# Patient Record
Sex: Male | Born: 1960
Health system: Southern US, Community
[De-identification: ages and names within clinical notes are randomized; demographics above are authoritative.]

## PROBLEM LIST (undated history)

## (undated) ENCOUNTER — Emergency Department (HOSPITAL_COMMUNITY): Admission: EM | Payer: 59 | Source: Home / Self Care

## (undated) DIAGNOSIS — I739 Peripheral vascular disease, unspecified: Secondary | ICD-10-CM

## (undated) HISTORY — DX: Peripheral vascular disease, unspecified: I73.9

---

## 1999-06-06 ENCOUNTER — Emergency Department (HOSPITAL_COMMUNITY): Admission: EM | Admit: 1999-06-06 | Discharge: 1999-06-06 | Payer: Self-pay | Admitting: Emergency Medicine

## 2006-02-06 ENCOUNTER — Emergency Department (HOSPITAL_COMMUNITY): Admission: EM | Admit: 2006-02-06 | Discharge: 2006-02-06 | Payer: Self-pay | Admitting: Emergency Medicine

## 2010-01-01 ENCOUNTER — Inpatient Hospital Stay (HOSPITAL_COMMUNITY): Admission: EM | Admit: 2010-01-01 | Discharge: 2010-01-07 | Payer: Self-pay | Admitting: Emergency Medicine

## 2010-01-01 ENCOUNTER — Emergency Department (HOSPITAL_COMMUNITY): Admission: EM | Admit: 2010-01-01 | Discharge: 2010-01-01 | Payer: Self-pay | Admitting: Family Medicine

## 2010-01-02 ENCOUNTER — Ambulatory Visit: Payer: Self-pay | Admitting: Vascular Surgery

## 2010-01-02 ENCOUNTER — Encounter (INDEPENDENT_AMBULATORY_CARE_PROVIDER_SITE_OTHER): Payer: Self-pay | Admitting: Internal Medicine

## 2010-01-06 ENCOUNTER — Encounter (INDEPENDENT_AMBULATORY_CARE_PROVIDER_SITE_OTHER): Payer: Self-pay | Admitting: Cardiology

## 2010-01-10 ENCOUNTER — Encounter: Admission: RE | Admit: 2010-01-10 | Discharge: 2010-02-05 | Payer: Self-pay | Admitting: Neurology

## 2010-01-24 ENCOUNTER — Ambulatory Visit: Payer: Self-pay | Admitting: Internal Medicine

## 2010-03-02 ENCOUNTER — Ambulatory Visit: Payer: Self-pay | Admitting: Internal Medicine

## 2010-03-02 ENCOUNTER — Inpatient Hospital Stay (HOSPITAL_COMMUNITY): Admission: EM | Admit: 2010-03-02 | Discharge: 2010-03-05 | Payer: Self-pay | Admitting: Emergency Medicine

## 2010-12-29 LAB — COMPREHENSIVE METABOLIC PANEL
ALT: 29 U/L (ref 0–53)
Alkaline Phosphatase: 78 U/L (ref 39–117)
BUN: 7 mg/dL (ref 6–23)
Creatinine, Ser: 0.91 mg/dL (ref 0.4–1.5)
GFR calc Af Amer: >60 mL/min (ref >60)
GFR calc non Af Amer: >60 mL/min (ref >60)
Glucose, Bld: 109 mg/dL — ABNORMAL HIGH (ref 70–99)
Total Bilirubin: 0.5 mg/dL (ref 0.3–1.2)

## 2010-12-29 LAB — URINALYSIS, ROUTINE W REFLEX MICROSCOPIC
Bilirubin Urine: NEGATIVE
Hgb urine dipstick: NEGATIVE
Specific Gravity, Urine: 1.024 (ref 1.005–1.030)
pH: 7 (ref 5.0–8.0)

## 2010-12-29 LAB — DRUGS OF ABUSE SCREEN W/O ALC, ROUTINE URINE
Amphetamine Screen, Ur: NEGATIVE
Barbiturate Quant, Ur: NEGATIVE
Benzodiazepines.: NEGATIVE
Marijuana Metabolite: NEGATIVE
Methadone: NEGATIVE

## 2010-12-29 LAB — DIFFERENTIAL
Basophils Absolute: 0.1 10*3/uL (ref 0.0–0.1)
Eosinophils Absolute: 0.2 10*3/uL (ref 0.0–0.7)
Lymphocytes Relative: 41 % (ref 12–46)
Lymphs Abs: 2.5 10*3/uL (ref 0.7–4.0)
Monocytes Absolute: 0.5 10*3/uL (ref 0.1–1.0)
Monocytes Relative: 8 % (ref 3–12)
Neutro Abs: 2.8 10*3/uL (ref 1.7–7.7)
Neutrophils Relative %: 47 % (ref 43–77)

## 2010-12-29 LAB — CK TOTAL AND CKMB (NOT AT ARMC)
CK, MB: 1.7 ng/mL (ref 0.3–4.0)
Relative Index: 0.9 (ref 0.0–2.5)
Total CK: 181 U/L (ref 7–232)

## 2010-12-29 LAB — CBC
HCT: 43 % (ref 39.0–52.0)
MCHC: 34.4 g/dL (ref 30.0–36.0)
RBC: 4.57 MIL/uL (ref 4.22–5.81)
WBC: 6.1 10*3/uL (ref 4.0–10.5)

## 2010-12-29 LAB — LIPID PANEL
Cholesterol: 203 mg/dL — ABNORMAL HIGH (ref 0–200)
HDL: 31 mg/dL — ABNORMAL LOW (ref >39)
Triglycerides: 125 mg/dL (ref <150)

## 2010-12-29 LAB — MAGNESIUM: Magnesium: 2.2 mg/dL (ref 1.5–2.5)

## 2010-12-29 LAB — C-REACTIVE PROTEIN: CRP: 1 mg/dL — ABNORMAL HIGH (ref <0.6)

## 2010-12-29 LAB — HEMOGLOBIN A1C: Mean Plasma Glucose: 114 mg/dL (ref <117)

## 2011-01-05 LAB — LIPID PANEL
LDL Cholesterol: 158 mg/dL — ABNORMAL HIGH (ref 0–99)
Triglycerides: 110 mg/dL (ref <150)

## 2011-01-05 LAB — CBC
HCT: 41.1 % (ref 39.0–52.0)
Hemoglobin: 14.1 g/dL (ref 13.0–17.0)
MCHC: 33.8 g/dL (ref 30.0–36.0)
MCHC: 34.4 g/dL (ref 30.0–36.0)
Platelets: 154 10*3/uL (ref 150–400)
Platelets: 171 10*3/uL (ref 150–400)
RBC: 4.49 MIL/uL (ref 4.22–5.81)
RDW: 15.3 % (ref 11.5–15.5)
WBC: 5.6 10*3/uL (ref 4.0–10.5)
WBC: 5.8 10*3/uL (ref 4.0–10.5)

## 2011-01-05 LAB — BETA-2-GLYCOPROTEIN I ABS, IGG/M/A: Beta-2 Glyco I IgG: 0 G Units (ref <20)

## 2011-01-05 LAB — DIFFERENTIAL
Basophils Absolute: 0 10*3/uL (ref 0.0–0.1)
Basophils Relative: 1 % (ref 0–1)
Eosinophils Absolute: 0.1 10*3/uL (ref 0.0–0.7)
Eosinophils Relative: 1 % (ref 0–5)
Monocytes Absolute: 0.5 10*3/uL (ref 0.1–1.0)
Neutro Abs: 3.1 10*3/uL (ref 1.7–7.7)

## 2011-01-05 LAB — TSH: TSH: 1.418 u[IU]/mL (ref 0.350–4.500)

## 2011-01-05 LAB — RAPID URINE DRUG SCREEN, HOSP PERFORMED
Amphetamines: NOT DETECTED
Barbiturates: NOT DETECTED
Benzodiazepines: NOT DETECTED
Opiates: NOT DETECTED

## 2011-01-05 LAB — BASIC METABOLIC PANEL
BUN: 6 mg/dL (ref 6–23)
CO2: 26 mEq/L (ref 19–32)
CO2: 30 mEq/L (ref 19–32)
Calcium: 8.8 mg/dL (ref 8.4–10.5)
Calcium: 9.1 mg/dL (ref 8.4–10.5)
Calcium: 9.4 mg/dL (ref 8.4–10.5)
Creatinine, Ser: 1.02 mg/dL (ref 0.4–1.5)
Creatinine, Ser: 1.11 mg/dL (ref 0.4–1.5)
GFR calc Af Amer: >60 mL/min (ref >60)
GFR calc non Af Amer: >60 mL/min (ref >60)
Glucose, Bld: 106 mg/dL — ABNORMAL HIGH (ref 70–99)
Sodium: 138 mEq/L (ref 135–145)

## 2011-01-05 LAB — URINALYSIS, ROUTINE W REFLEX MICROSCOPIC
Glucose, UA: NEGATIVE mg/dL
Hgb urine dipstick: NEGATIVE
Specific Gravity, Urine: 1.005 (ref 1.005–1.030)
Urobilinogen, UA: 0.2 mg/dL (ref 0.0–1.0)

## 2011-01-05 LAB — PROTEIN C ACTIVITY: Protein C Activity: 155 % — ABNORMAL HIGH (ref 75–133)

## 2011-01-05 LAB — PROTIME-INR
INR: 1.06 (ref 0.00–1.49)
Prothrombin Time: 13.7 seconds (ref 11.6–15.2)

## 2011-01-05 LAB — LUPUS ANTICOAGULANT PANEL
PTTLA 4:1 Mix: 44.5 secs (ref 36.3–48.8)
PTTLA Confirmation: 0 secs (ref <8.0)

## 2011-01-05 LAB — PROTEIN S ACTIVITY: Protein S Activity: 109 % (ref 69–129)

## 2011-01-05 LAB — HOMOCYSTEINE
Homocysteine: 12.9 umol/L (ref 4.0–15.4)
Homocysteine: 9 umol/L (ref 4.0–15.4)

## 2011-01-05 LAB — CARDIOLIPIN ANTIBODIES, IGG, IGM, IGA
Anticardiolipin IgA: 5 APL U/mL — ABNORMAL LOW (ref <22)
Anticardiolipin IgM: 2 MPL U/mL — ABNORMAL LOW (ref <11)

## 2011-01-05 LAB — GLUCOSE, CAPILLARY: Glucose-Capillary: 104 mg/dL — ABNORMAL HIGH (ref 70–99)

## 2011-01-05 LAB — ANTI-NUCLEAR AB-TITER (ANA TITER): ANA Titer 1: 1:640 {titer} — ABNORMAL HIGH

## 2011-01-05 LAB — FACTOR 5 LEIDEN

## 2011-01-05 LAB — RPR: RPR Ser Ql: NONREACTIVE

## 2017-04-22 ENCOUNTER — Encounter: Payer: Self-pay | Admitting: Family Medicine

## 2017-04-22 ENCOUNTER — Ambulatory Visit (INDEPENDENT_AMBULATORY_CARE_PROVIDER_SITE_OTHER): Payer: Worker's Compensation | Admitting: Physician Assistant

## 2017-04-22 ENCOUNTER — Ambulatory Visit (INDEPENDENT_AMBULATORY_CARE_PROVIDER_SITE_OTHER): Payer: Worker's Compensation

## 2017-04-22 VITALS — BP 158/88 | HR 48 | Temp 98.6°F | Resp 16 | Ht 67.0 in | Wt 213.0 lb

## 2017-04-22 DIAGNOSIS — Z23 Encounter for immunization: Secondary | ICD-10-CM | POA: Diagnosis not present

## 2017-04-22 DIAGNOSIS — S3992XA Unspecified injury of lower back, initial encounter: Secondary | ICD-10-CM

## 2017-04-22 DIAGNOSIS — S0180XA Unspecified open wound of other part of head, initial encounter: Secondary | ICD-10-CM | POA: Diagnosis not present

## 2017-04-22 DIAGNOSIS — S0181XA Laceration without foreign body of other part of head, initial encounter: Secondary | ICD-10-CM

## 2017-04-22 MED ORDER — CYCLOBENZAPRINE HCL 10 MG PO TABS: 5.0000 mg | ORAL_TABLET | Freq: Three times a day (TID) | ORAL | 0 refills | Status: DC | PRN

## 2017-04-22 NOTE — Patient Instructions (Addendum)
Facial Laceration A facial laceration is a cut on the face. These injuries can be painful and cause bleeding. Some cuts may need to be closed with stitches (sutures), skin adhesive strips, or wound glue. Cuts usually heal quickly but can leave a scar. It can take 1-2 years for the scar to go away completely. Follow these instructions at home:  Only take medicines as told by your doctor.  Follow your doctor's instructions for wound care. For Stitches:  Keep the cut clean and dry.  If you have a bandage (dressing), change it at least once a day. Change the bandage if it gets wet or dirty, or as told by your doctor.  Wash the cut with soap and water 2 times a day. Rinse the cut with water. Pat it dry with a clean towel.  Put a thin layer of medicated cream on the cut as told by your doctor.  You may shower after the first 24 hours. Do not soak the cut in water until the stitches are removed.  Have your stitches removed as told by your doctor.  Do not wear any makeup until a few days after your stitches are removed. For Skin Adhesive Strips:  Keep the cut clean and dry.  Do not get the strips wet. You may take a bath, but be careful to keep the cut dry.  If the cut gets wet, pat it dry with a clean towel.  The strips will fall off on their own. Do not remove the strips that are still stuck to the cut. For Wound Glue:  You may shower or take baths. Do not soak or scrub the cut. Do not swim. Avoid heavy sweating until the glue falls off on its own. After a shower or bath, pat the cut dry with a clean towel.  Do not put medicine or makeup on your cut until the glue falls off.  If you have a bandage, do not put tape over the glue.  Avoid lots of sunlight or tanning lamps until the glue falls off.  The glue will fall off on its own in 5-10 days. Do not pick at the glue. After Healing:  Put sunscreen on the cut for the first year to reduce your scar. Contact a doctor if:  You  have a fever. Get help right away if:  Your cut area gets red, painful, or puffy (swollen).  You see a yellowish-white fluid (pus) coming from the cut. This information is not intended to replace advice given to you by your health care provider. Make sure you discuss any questions you have with your health care provider. Document Released: 03/16/2008 Document Revised: 03/05/2016 Document Reviewed: 05/11/2013 Elsevier Interactive Patient Education  2017 [employer]     IF you received an x-ray today, you will receive an invoice from [facility]. Please contact [facility] at [phone] with questions or concerns regarding your invoice.   IF you received labwork today, you will receive an invoice from [facility]. Please contact [facility] at [phone] with questions or concerns regarding your invoice.   Our billing staff will not be able to assist you with questions regarding bills from these companies.  You will be contacted with the lab results as soon as they are available. The fastest way to get your results is to activate your My Chart account. Instructions are located on the last page of this paperwork. If you have not heard from us regarding the results in 2 weeks, please contact this office.

## 2017-04-22 NOTE — Progress Notes (Addendum)
PRIMARY CARE AT [facility] [address], [place] [address] [phone]  Date:  04/22/2017   Name:  Devin Guzman   DOB:  1961/01/12   MRN:  [id number]  PCP:  No primary care provider on file.    History of Present Illness:  Devin Guzman is a 56 y.o. male patient who presents to PCP with  Chief Complaint  Patient presents with  . Head Injury    Hit with some type of materal at work - laceration on forehead  . Back Pain    Was knocked against a machine when he was hit      Patient was working with machine, when she was knocked in the back by one machine at her back and into another machine injuring his back then head.  He came here immediately within the hour.   He has mild pain of his mid back where he was hit.  No numbness or tingling.  No loc.  No vision changes.     There are no active problems to display for this patient.   No past medical history on file.  No past surgical history on file.   No family history on file.  Allergies  Allergen Reactions  . Codeine     Medication list has been reviewed and updated.  No current outpatient prescriptions on file prior to visit.   No current facility-administered medications on file prior to visit.     ROS ROS otherwise unremarkable unless listed above.  Physical Examination: BP (!) 158/88   Pulse (!) 48   Temp 98.6 F (37 C) (Oral)   Resp 16   Ht 5\' 7"  (1.702 m)   Wt 213 lb (96.6 kg)   SpO2 98%   BMI 33.36 kg/m  Ideal Body Weight: Weight in (lb) to have BMI = 25: 159.3  Physical Exam  Constitutional: He is oriented to person, place, and time. He appears well-developed and well-nourished. No distress.  HENT:  Head: Normocephalic and atraumatic.  Normal surrounding eye muscular movement.  2cm laceration just superior to the right eyebrow along the supra-orbital ridge is about .7cm in depth without visualization of bone.    Eyes: Pupils are equal, round, and reactive to light. Conjunctivae, EOM and lids  are normal. Right conjunctiva is not injected. Left conjunctiva is not injected.  Cardiovascular: Normal rate.   Pulmonary/Chest: Effort normal. No respiratory distress.  Musculoskeletal:  Thoracic spine tenderness and adjacent musculatrure without erythema or noticealbe swelling at this time.    Neurological: He is alert and oriented to person, place, and time.  Skin: Skin is warm and dry. He is not diaphoretic.  Psychiatric: He has a normal mood and affect. His behavior is normal.    Dg Thoracic Spine 2 View  Result Date: 04/22/2017 CLINICAL DATA:  Patient with thoracic spine tenderness after being hit in the back. Initial encounter. EXAM: THORACIC SPINE 2 VIEWS COMPARISON:  None. FINDINGS: Focal rightward curvature of the midthoracic spine. Multilevel degenerative disc disease involving the thoracic spine. Relative preservation of the vertebral body heights. No definite evidence for acute fracture. IMPRESSION: No definite evidence for acute displaced fracture. Multilevel degenerative disc disease. Electronically Signed   By: Annia Belt M.D.   On: 04/22/2017 11:58   Procedure: verbal consent obtained.  1% lidocaine placed at the 2cm laceration at the supra-orbital ridge.  Anesthesia obtained.  Cleansed with soap and water.  7-0 ethilon utilized to place 8 simple interrupted sutures.  Cleansed with  normal saline.  Dressing applied today.    Assessment and Plan: Devin Guzman is a 56 y.o. male who is here today for cc of back pain and facial laceration Suturing without complication. Advised icing. Restriction of lifting and musculoskeletal movement given.  See letter.  msk relaxant. Tetanus updated.   rtc in 6 days for suture removal and recheck. Injury of back, initial encounter - Plan: DG Thoracic Spine 2 View  Facial laceration, initial encounter - Plan: Td vaccine greater than or equal to 7yo preservative free IM  Trena PlattStephanie Arafat Cocuzza, PA-C [facility] 7/15/20188:32 AM

## 2017-04-25 NOTE — Addendum Note (Signed)
Addended by: Trena PlattENGLISH, Ganesh Deeg D on: 04/25/2017 09:49 AM   Modules accepted: Level of Service

## 2017-04-28 ENCOUNTER — Ambulatory Visit (INDEPENDENT_AMBULATORY_CARE_PROVIDER_SITE_OTHER): Payer: Worker's Compensation | Admitting: Urgent Care

## 2017-04-28 DIAGNOSIS — Z4802 Encounter for removal of sutures: Secondary | ICD-10-CM

## 2017-04-28 DIAGNOSIS — S0181XD Laceration without foreign body of other part of head, subsequent encounter: Secondary | ICD-10-CM

## 2017-04-28 NOTE — Progress Notes (Signed)
   Patient: Devin Guzman [id number]  Subjective: Devin Guzman is returning for suture removal. Patient was initially seen 04/22/2017 and had sutures placed over his right forehead. Patient has kept wound clean, dry. Denies fever, drainage of pus or bleeding.   Objective:   Physical Exam  Constitutional: He is oriented to person, place, and time. He appears well-developed and well-nourished.  HENT:  Head: Head is with laceration.    Pulmonary/Chest: Effort normal.  Neurological: He is alert and oriented to person, place, and time.    Assessment and Plan: Wound is healing still, recommended patient rtc on 05/01/2017 for a recheck.  Wallis BambergMario Rozann Holts, PA-C [facility] [phone] 04/28/2017  9:32 AM

## 2017-05-01 ENCOUNTER — Ambulatory Visit (INDEPENDENT_AMBULATORY_CARE_PROVIDER_SITE_OTHER): Payer: Worker's Compensation | Admitting: Family Medicine

## 2017-05-01 ENCOUNTER — Encounter: Payer: Self-pay | Admitting: Family Medicine

## 2017-05-01 VITALS — BP 133/76 | HR 58 | Temp 97.6°F | Resp 18 | Ht 68.0 in | Wt 214.2 lb

## 2017-05-01 DIAGNOSIS — Z4802 Encounter for removal of sutures: Secondary | ICD-10-CM

## 2017-05-01 NOTE — Patient Instructions (Addendum)
     IF you received an x-ray today, you will receive an invoice from [facility]. Please contact [facility] at [phone] with questions or concerns regarding your invoice.   IF you received labwork today, you will receive an invoice from [facility]. Please contact [facility] at [phone] with questions or concerns regarding your invoice.   Our billing staff will not be able to assist you with questions regarding bills from these companies.  You will be contacted with the lab results as soon as they are available. The fastest way to get your results is to activate your My Chart account. Instructions are located on the last page of this paperwork. If you have not heard from us regarding the results in 2 weeks, please contact this office.    Suture Removal, Care After Refer to this sheet in the next few weeks. These instructions provide you with information on caring for yourself after your procedure. Your health care provider may also give you more specific instructions. Your treatment has been planned according to current medical practices, but problems sometimes occur. Call your health care provider if you have any problems or questions after your procedure. What can I expect after the procedure? After your stitches (sutures) are removed, it is typical to have the following:  Some discomfort and swelling in the wound area.  Slight redness in the area.  Follow these instructions at home:  If you have skin adhesive strips over the wound area, do not take the strips off. They will fall off on their own in a few days. If the strips remain in place after 14 days, you may remove them.  Change any bandages (dressings) at least once a day or as directed by your health care provider. If the bandage sticks, soak it off with warm, soapy water.  Apply cream or ointment only as directed by your health care provider. If using cream or ointment, wash the area with soap and water 2  times a day to remove all the cream or ointment. Rinse off the soap and pat the area dry with a clean towel.  Keep the wound area dry and clean. If the bandage becomes wet or dirty, or if it develops a bad smell, change it as soon as possible.  Continue to protect the wound from injury.  Use sunscreen when out in the sun. New scars become sunburned easily. Contact a health care provider if:  You have increasing redness, swelling, or pain in the wound.  You see pus coming from the wound.  You have a fever.  You notice a bad smell coming from the wound or dressing.  Your wound breaks open (edges not staying together). This information is not intended to replace advice given to you by your health care provider. Make sure you discuss any questions you have with your health care provider. Document Released: 06/23/2001 Document Revised: 03/05/2016 Document Reviewed: 05/10/2013 Elsevier Interactive Patient Education  2017 Elsevier Inc.  

## 2017-05-01 NOTE — Progress Notes (Signed)
  Chief Complaint  Patient presents with  . Suture / Staple Removal  . Follow-up    HPI  This is a work related injury  Devin Guzman is a 56yo male with sutures here for removal Sutures placed on 04/22/17 He had an injury to the right upper brow He reports that the wound itches but there is no bleeding He had follow up on 04/28/17 and the wound was still healing so the sutures were not removed    History reviewed. No pertinent past medical history.  Current Outpatient Prescriptions  Medication Sig Dispense Refill  . cyclobenzaprine (FLEXERIL) 10 MG tablet Take 0.5-1 tablets (5-10 mg total) by mouth 3 (three) times daily as needed for muscle spasms. (Patient not taking: Reported on 05/01/2017) 30 tablet 0   No current facility-administered medications for this visit.     Allergies:  Allergies  Allergen Reactions  . Codeine     History reviewed. No pertinent surgical history.  Social History   Social History  . Marital status: Single    Spouse name: N/A  . Number of children: N/A  . Years of education: N/A   Social History Main Topics  . Smoking status: Current Every Day Smoker    Packs/day: 0.25    Types: Cigarettes  . Smokeless tobacco: Never Used  . Alcohol use 1.8 oz/week    3 Cans of beer per week  . Drug use: No  . Sexual activity: Not Asked   Other Topics Concern  . None   Social History Narrative  . None    ROS  Objective: Vitals:   05/01/17 0811  BP: 133/76  Pulse: (!) 58  Resp: 18  Temp: 97.6 F (36.4 C)  TempSrc: Oral  SpO2: 98%  Weight: 214 lb 3.2 oz (97.2 kg)  Height: 5\' 8"  (1.727 m)    Physical Exam   8 sutures removed from the right upper brow Skin healing well    Assessment and Plan Devin Guzman was seen today for suture / staple removal and follow-up.  Diagnoses and all orders for this visit:  Visit for suture removal  -    Pt tolerated well Advised to use vitamin E oil on area to minimize scarring   Priyana Mccarey A  Creta LevinStallings

## 2017-07-30 ENCOUNTER — Ambulatory Visit (INDEPENDENT_AMBULATORY_CARE_PROVIDER_SITE_OTHER): Payer: 59

## 2017-07-30 ENCOUNTER — Ambulatory Visit (INDEPENDENT_AMBULATORY_CARE_PROVIDER_SITE_OTHER): Payer: 59 | Admitting: Family Medicine

## 2017-07-30 ENCOUNTER — Encounter: Payer: Self-pay | Admitting: Family Medicine

## 2017-07-30 VITALS — BP 144/80 | HR 43 | Temp 97.4°F | Resp 18 | Ht 69.57 in | Wt 211.6 lb

## 2017-07-30 DIAGNOSIS — M2041 Other hammer toe(s) (acquired), right foot: Secondary | ICD-10-CM | POA: Diagnosis not present

## 2017-07-30 DIAGNOSIS — L84 Corns and callosities: Secondary | ICD-10-CM

## 2017-07-30 DIAGNOSIS — G8929 Other chronic pain: Secondary | ICD-10-CM

## 2017-07-30 DIAGNOSIS — M79674 Pain in right toe(s): Secondary | ICD-10-CM | POA: Diagnosis not present

## 2017-07-30 DIAGNOSIS — Z23 Encounter for immunization: Secondary | ICD-10-CM

## 2017-07-30 DIAGNOSIS — M79671 Pain in right foot: Secondary | ICD-10-CM | POA: Diagnosis not present

## 2017-07-30 NOTE — Progress Notes (Signed)
   10/19/20189:32 AM  Wetzel BjornstadJohn R Leeson [birth year], 56 y.o. male [id number]  Chief Complaint  Patient presents with  . Toe Injury    X 1 year- RT FOOT 3RD TOE     HPI:   Patient is a 56 y.o. male who presents today for evaluation of about 6 months of right foot 3rd toe pain. He states it started after he stepped on on of his grandson's toy car. He states it hurts underneath the toe. He feels it was made worse by having to wear toe-steeled boots. He denies any numbness or tingling, any weakness.   Depression screen [facility] 07/30/2017 05/01/2017 04/22/2017  Decreased Interest 0 0 0  Down, Depressed, Hopeless 0 0 0  PHQ - 2 Score 0 0 0    Allergies  Allergen Reactions  . Codeine     Prior to Admission medications   Medication Sig Start Date End Date Taking? Authorizing Provider  cyclobenzaprine (FLEXERIL) 10 MG tablet Take 0.5-1 tablets (5-10 mg total) by mouth 3 (three) times daily as needed for muscle spasms. Patient not taking: Reported on 05/01/2017 04/22/17   Garnetta BuddyEnglish, Stephanie D, PA    History reviewed. No pertinent past medical history.  History reviewed. No pertinent surgical history.  Social History  Substance Use Topics  . Smoking status: Current Every Day Smoker    Packs/day: 0.25    Types: Cigarettes  . Smokeless tobacco: Never Used  . Alcohol use 1.8 oz/week    3 Cans of beer per week    Family History  Problem Relation Age of Onset  . Hypertension Mother     ROS Per hpi  OBJECTIVE:  Blood pressure (!) 144/80, pulse (!) 43, temperature (!) 97.4 F (36.3 C), temperature source Oral, resp. rate 18, height 5' 9.57" (1.767 m), weight 211 lb 9.6 oz (96 kg), SpO2 98 %.  Physical Exam  Gen: AAOx3, NAD MSK: right foot skin is intact, + 2 pedal pulses, normal ROM, strength and sensation. 3rd toe - hammer toe with a moderate sized callous along inferior lateral border. Nails discolored, thickened, long.  No results found for this or any previous visit (from the  past 24 hour(s)).  Dg Foot 2 Views Right  Result Date: 07/30/2017 CLINICAL DATA:  Chronic right third toe pain. EXAM: RIGHT FOOT - 2 VIEW COMPARISON:  None. FINDINGS: There is no evidence of fracture or dislocation. There is no evidence of arthropathy. Mild posterior calcaneal spurring is noted. Soft tissues are unremarkable. IMPRESSION: No acute abnormality seen in the right foot. Third toe is unremarkable. Mild posterior calcaneal spurring. Electronically Signed   By: Lupita RaiderJames  Green Jr, M.D.   On: 07/30/2017 08:43     ASSESSMENT and PLAN  1. Chronic pain of toe of right foot Xray normal. Discomfort most likely due to hammer toe. Referring to podiatry for further treatment. - DG Foot 2 Views Right; Future - Ambulatory referral to Podiatry - Care order/instruction:  2. Need for influenza vaccination - Flu Vaccine QUAD 36+ mos IM - given today  3. Hammer toe of right foot - Ambulatory referral to Podiatry  4. Pre-ulcerative corn or callous - Ambulatory referral to Podiatry  Return if symptoms worsen or fail to improve.    Myles LippsIrma M Santiago, MD Primary Care at [facility] [address] [place], [place] [address] Ph.  [phone] Fax [phone]

## 2017-07-30 NOTE — Patient Instructions (Signed)
     IF you received an x-ray today, you will receive an invoice from [facility]. Please contact  Radiology at [phone] with questions or concerns regarding your invoice.   IF you received labwork today, you will receive an invoice from [facility]. Please contact [facility] at [phone] with questions or concerns regarding your invoice.   Our billing staff will not be able to assist you with questions regarding bills from these companies.  You will be contacted with the lab results as soon as they are available. The fastest way to get your results is to activate your My Chart account. Instructions are located on the last page of this paperwork. If you have not heard from us regarding the results in 2 weeks, please contact this office.     

## 2018-01-03 ENCOUNTER — Ambulatory Visit: Payer: 59 | Admitting: Podiatry

## 2018-01-03 ENCOUNTER — Encounter: Payer: Self-pay | Admitting: Podiatry

## 2018-01-03 DIAGNOSIS — L84 Corns and callosities: Secondary | ICD-10-CM

## 2018-01-03 NOTE — Progress Notes (Signed)
Subjective:    Patient ID: Devin Guzman, male    DOB: 08/24/1961, 57 y.o.   MRN: [id number]  HPI 57 year old male presents the office today for concerns of possible ingrown toenail corn to the left fifth toe which is painful with pressure in shoes.  He also has a callus on the right third toe.  He denies any recent injury or trauma.  He does wear steel toed shoes.  He denies any swelling or redness or drainage she said no recent treatment for this.  He has no other concerns.   Review of Systems  All other systems reviewed and are negative.  History reviewed. No pertinent past medical history.  History reviewed. No pertinent surgical history.   Current Outpatient Medications:  .  cyclobenzaprine (FLEXERIL) 10 MG tablet, Take 0.5-1 tablets (5-10 mg total) by mouth 3 (three) times daily as needed for muscle spasms. (Patient not taking: Reported on 05/01/2017), Disp: 30 tablet, Rfl: 0  Allergies  Allergen Reactions  . Codeine     Social History   Socioeconomic History  . Marital status: Single    Spouse name: Not on file  . Number of children: Not on file  . Years of education: Not on file  . Highest education level: Not on file  Occupational History  . Not on file  Social Needs  . Financial resource strain: Not on file  . Food insecurity:    Worry: Not on file    Inability: Not on file  . Transportation needs:    Medical: Not on file    Non-medical: Not on file  Tobacco Use  . Smoking status: Current Every Day Smoker    Packs/day: 0.25    Types: Cigarettes  . Smokeless tobacco: Never Used  Substance and Sexual Activity  . Alcohol use: Yes    Alcohol/week: 1.8 oz    Types: 3 Cans of beer per week  . Drug use: No  . Sexual activity: Not on file  Lifestyle  . Physical activity:    Days per week: Not on file    Minutes per session: Not on file  . Stress: Not on file  Relationships  . Social connections:    Talks on phone: Not on file    Gets together: Not on  file    Attends religious service: Not on file    Active member of club or organization: Not on file    Attends meetings of clubs or organizations: Not on file    Relationship status: Not on file  . Intimate partner violence:    Fear of current or ex partner: Not on file    Emotionally abused: Not on file    Physically abused: Not on file    Forced sexual activity: Not on file  Other Topics Concern  . Not on file  Social History Narrative  . Not on file        Objective:   Physical Exam General: AAO x3, NAD  Dermatological: Hyperkeratotic lesion to the left fifth toe just lateral to the nail corner consistent with a listers corn.  Also hyperkeratotic lesion to the right third toe.  Upon debridement of both these areas there was no underlying ulceration, drainage or any signs of infection present.  There is no other open lesions or pre-ulcerative lesions identified today.  Vascular: Dorsalis Pedis artery and Posterior Tibial artery pedal pulses are 2/4 bilateral with immedate capillary fill time.  There is no pain with calf compression, swelling,  warmth, erythema.   Neruologic: Grossly intact via light touch bilateral. Protective threshold with Semmes Wienstein monofilament intact to all pedal sites bilateral.   Musculoskeletal: Adductovarus present fifth toes.  Muscular strength 5/5 in all groups tested bilateral.  Gait: Unassisted, Nonantalgic.     Assessment & Plan:  57 year old male with hyperkeratotic lesions bilaterally -Treatment options discussed including all alternatives, risks, and complications -Etiology of symptoms were discussed -Lesions were sharply debrided x2 without any complications or bleeding.  Discussed likely recurrence of I dispensed offloading pads to help take pressure off the area to help with this.  Also discussed shoe gear modifications.  Vivi BarrackMatthew R Eliana Lueth DPM

## 2018-01-12 ENCOUNTER — Encounter: Payer: Self-pay | Admitting: Physician Assistant

## 2018-01-19 DIAGNOSIS — H04123 Dry eye syndrome of bilateral lacrimal glands: Secondary | ICD-10-CM | POA: Diagnosis not present

## 2018-01-19 DIAGNOSIS — H33333 Multiple defects of retina without detachment, bilateral: Secondary | ICD-10-CM | POA: Diagnosis not present

## 2018-01-19 DIAGNOSIS — H2513 Age-related nuclear cataract, bilateral: Secondary | ICD-10-CM | POA: Diagnosis not present

## 2018-01-27 ENCOUNTER — Encounter (INDEPENDENT_AMBULATORY_CARE_PROVIDER_SITE_OTHER): Payer: Self-pay | Admitting: Ophthalmology

## 2019-03-23 ENCOUNTER — Ambulatory Visit: Payer: 59 | Admitting: Podiatry

## 2019-03-23 ENCOUNTER — Other Ambulatory Visit: Payer: Self-pay

## 2019-03-23 DIAGNOSIS — Q828 Other specified congenital malformations of skin: Secondary | ICD-10-CM

## 2019-03-23 DIAGNOSIS — M79672 Pain in left foot: Secondary | ICD-10-CM

## 2019-03-23 DIAGNOSIS — M79671 Pain in right foot: Secondary | ICD-10-CM

## 2019-03-29 NOTE — Progress Notes (Signed)
Subjective: 58 year old male presents the office today for concerns of pain to his fifth toes.  He states he gets calluses on the fifth toes on both of his big toes also he states that his nails become thickened discolored and asked him what shoes he can wear at a wider across his foot so that way he does not put pressure on the digits.  No open sores and denies any redness or drainage or any swelling. Denies any systemic complaints such as fevers, chills, nausea, vomiting. No acute changes since last appointment, and no other complaints at this time.   Objective: AAO x3, NAD DP/PT pulses palpable bilaterally, CRT less than 3 seconds Hyperkeratotic lesions bilateral fifth toes as well as bilateral hallux.  Upon debridement there is no underlying ulceration, drainage or any signs of infection.  Nails are mildly hypertrophic, dystrophic.  No pain to the nail there is no surrounding redness or drainage or any signs of infection.  No open lesions or pre-ulcerative lesions.  No pain with calf compression, swelling, warmth, erythema  Assessment: Symptomatic hyperkeratotic lesions  Plan: -All treatment options discussed with the patient including all alternatives, risks, complications.  -Patient debrided the hyperkeratotic lesions today without any complications or bleeding.  We discussed shoe modifications in order to help take pressure off quickly.  Discussed treatment options for nail fungus. -Patient encouraged to call the office with any questions, concerns, change in symptoms.   Trula Slade DPM

## 2019-06-15 ENCOUNTER — Telehealth: Payer: Self-pay | Admitting: Podiatry

## 2019-06-15 NOTE — Telephone Encounter (Signed)
Pt's wife, Benjamine Mola states pt is at work until [employer] and leaves for work about 6:00am. I told Benjamine Mola I would leave a message on pt's mobile voice mail with the response to his question.

## 2019-06-15 NOTE — Telephone Encounter (Signed)
Left message on pt's mobile phone that he would benefit from having the toenails tested to see if fungal and what type, then would decide if oral medication would treat, and blood work would be needed prior to the oral medication being prescribed and topical could be prescribed if fungus is present and that would be 1-2 times daily for 6-9 months.

## 2019-06-15 NOTE — Telephone Encounter (Signed)
Pt is wanting to know what he can take or if something can be prescribed for his toenail fungus.

## 2019-07-06 ENCOUNTER — Other Ambulatory Visit: Payer: Self-pay

## 2019-07-06 ENCOUNTER — Ambulatory Visit: Payer: 59 | Admitting: Podiatry

## 2019-07-06 DIAGNOSIS — B351 Tinea unguium: Secondary | ICD-10-CM

## 2019-07-06 DIAGNOSIS — Q828 Other specified congenital malformations of skin: Secondary | ICD-10-CM

## 2019-07-06 DIAGNOSIS — M79671 Pain in right foot: Secondary | ICD-10-CM

## 2019-07-06 DIAGNOSIS — M79672 Pain in left foot: Secondary | ICD-10-CM

## 2019-07-06 NOTE — Addendum Note (Signed)
Addended by: Wardell Heath on: 07/06/2019 09:03 AM   Modules accepted: Orders

## 2019-07-06 NOTE — Progress Notes (Signed)
Subjective: 58 y.o. returns the office today for painful, elongated, thickened toenails which he cannot trim himself. Denies any redness or drainage around the nails.  He is interested in nail fungus treatment.  He also has painful calluses the heels have trimmed.  He also gets pain along the hammertoes.  No recent injury.  Denies any acute changes since last appointment and no new complaints today. Denies any systemic complaints such as fevers, chills, nausea, vomiting.   PCP: Patient, No Pcp Per  Objective: AAO 3, NAD DP/PT pulses palpable, CRT less than 3 seconds Nails hypertrophic, dystrophic, elongated, brittle, discolored 10. There is tenderness overlying the nails 1-5 bilaterally. There is no surrounding erythema or drainage along the nail sites. Hyperkeratotic lesions bilateral hallux and fifth digits.  No ongoing ulceration drainage or signs of infection. No other areas of tenderness bilateral lower extremities. No overlying edema, erythema, increased warmth. No pain with calf compression, swelling, warmth, erythema.  Assessment: Patient presents with symptomatic onychomycosis interested in treatment for nail fungus, proptotic lesions  Plan: -Treatment options including alternatives, risks, complications were discussed -Nails sharply debrided 10 without complication/bleeding.  Sub-the nails for culture.  Will likely do onychomycosis treatment will likely not be Lamisil as he is a daily drinker. -Debrided hyperkeratotic lesions x4 without complications or bleeding -Discussed daily foot inspection. If there are any changes, to call the office immediately.  -Follow-up in 3 months or sooner if any problems are to arise. In the meantime, encouraged to call the office with any questions, concerns, changes symptoms.  Celesta Gentile, DPM

## 2019-07-21 ENCOUNTER — Encounter: Payer: Self-pay | Admitting: Podiatry

## 2019-07-21 ENCOUNTER — Telehealth: Payer: Self-pay | Admitting: *Deleted

## 2019-07-21 NOTE — Telephone Encounter (Signed)
-----   Message from Trula Slade, DPM sent at 07/21/2019  6:01 AM EDT ----- + for fungus. Would do topical through [place] to include lamsil. I would not do oral Lamisil due to alcohol use.

## 2019-07-21 NOTE — Telephone Encounter (Signed)
Unable to leave a message on home phone, voicemail box did not have enough message space per voicemail message.

## 2019-07-21 NOTE — Progress Notes (Signed)
+   fungus on culture. Due to alcohol use I would not advise on oral lamisil. Will do topical but if he wants oral can do fluconazole. Also shows melanin pigment. Will monitor for any changes. Likely benign given race and fugnus.

## 2019-07-21 NOTE — Telephone Encounter (Signed)
Left message for pt to call for results and instructions. 

## 2019-07-25 MED ORDER — NONFORMULARY OR COMPOUNDED ITEM: 11 refills | Status: DC

## 2019-07-25 NOTE — Telephone Encounter (Signed)
I called and spoke with pt, I informed of Dr. Leigh Aurora review of results and orders for the [facility] antifungal topical use and they would call with the insurance coverage and delivery information. Pt states understanding. Faxed orders to [facility].

## 2019-07-25 NOTE — Telephone Encounter (Signed)
Pt states leave a message on his voicemail he will not be able to pick up.

## 2019-07-25 NOTE — Telephone Encounter (Signed)
Left message on pt's mobile phone to call for results and instructions.

## 2019-07-28 ENCOUNTER — Telehealth: Payer: Self-pay | Admitting: *Deleted

## 2019-07-28 NOTE — Telephone Encounter (Signed)
I informed pt of [place] [phone], to discuss the rx that was confirmed received 07/25/2019.

## 2019-07-28 NOTE — Telephone Encounter (Signed)
Pt states he was suppose to get medication for his nails and hasn't heard anything.

## 2019-10-03 ENCOUNTER — Ambulatory Visit: Payer: 59 | Admitting: Podiatry

## 2019-10-03 ENCOUNTER — Encounter: Payer: Self-pay | Admitting: Podiatry

## 2019-10-03 ENCOUNTER — Other Ambulatory Visit: Payer: Self-pay

## 2019-10-03 DIAGNOSIS — B351 Tinea unguium: Secondary | ICD-10-CM

## 2019-10-03 DIAGNOSIS — M79674 Pain in right toe(s): Secondary | ICD-10-CM | POA: Diagnosis not present

## 2019-10-03 DIAGNOSIS — Q828 Other specified congenital malformations of skin: Secondary | ICD-10-CM

## 2019-10-03 DIAGNOSIS — M79675 Pain in left toe(s): Secondary | ICD-10-CM

## 2019-10-09 MED ORDER — CICLOPIROX 8 % EX SOLN: Freq: Every day | CUTANEOUS | 0 refills | Status: DC

## 2019-10-09 NOTE — Progress Notes (Signed)
Subjective: 58 y.o. returns the office today for painful, elongated, thickened toenails which he cannot trim himself. Denies any redness or drainage around the nails.  He has been using a topical [facility] and is asking for refill but he wants a medication he can pick up at his regular pharmacy and not the compound.  Denies any acute changes since last appointment and no new complaints today. Denies any systemic complaints such as fevers, chills, nausea, vomiting.   PCP: Patient, No Pcp Per  Objective: AAO 3, NAD DP/PT pulses palpable, CRT less than 3 seconds Nails hypertrophic, dystrophic, elongated, brittle, discolored 10. There is tenderness overlying the nails 1-5 bilaterally. There is no surrounding erythema or drainage along the nail sites. Hyperkeratotic lesions bilateral hallux and fifth digits.  No ongoing ulceration drainage or signs of infection. No other areas of tenderness bilateral lower extremities. No overlying edema, erythema, increased warmth. No pain with calf compression, swelling, warmth, erythema.  Assessment: Patient presents with symptomatic onychomycosis interested in treatment for nail fungus, proptotic lesions  Plan: -Treatment options including alternatives, risks, complications were discussed -Nails sharply debrided 10 without complication/bleeding.  He does not want to the compound.  Although it needs to be a better treatment for him he wants a prescription I can send to his regular pharmacy.  Prescribed Penlac and directed on use. -Debrided hyperkeratotic lesions x4 without complications or bleeding -Discussed daily foot inspection. If there are any changes, to call the office immediately.  -Follow-up in 3 months or sooner if any problems are to arise. In the meantime, encouraged to call the office with any questions, concerns, changes symptoms.  Celesta Gentile, DPM

## 2019-10-26 ENCOUNTER — Encounter: Payer: Self-pay | Admitting: Podiatry

## 2019-10-26 ENCOUNTER — Other Ambulatory Visit: Payer: Self-pay

## 2019-10-26 ENCOUNTER — Ambulatory Visit: Payer: 59 | Admitting: Podiatry

## 2019-10-26 DIAGNOSIS — M205X9 Other deformities of toe(s) (acquired), unspecified foot: Secondary | ICD-10-CM | POA: Diagnosis not present

## 2019-10-26 DIAGNOSIS — L84 Corns and callosities: Secondary | ICD-10-CM | POA: Diagnosis not present

## 2019-10-26 DIAGNOSIS — Q828 Other specified congenital malformations of skin: Secondary | ICD-10-CM

## 2019-10-26 NOTE — Patient Instructions (Signed)
The surgery we discussed would be to straighten out your 5th toes. You would have stiches in for about 3 weeks and you would be in a boot/special shoe for about 4-6 weeks. You would need to be out of work unless you can wear open toe boot/shoe at work. You would still need to take off about 2 weeks of work to let the incision heal.

## 2019-11-02 NOTE — Progress Notes (Signed)
Subjective: 59 year old male presents the office today for concerns of pain to his toes mostly his fifth toes.  He denies any recent injury or trauma he states that they are painful wearing shoes.  He denies any open sores or redness or drainage. Denies any systemic complaints such as fevers, chills, nausea, vomiting. No acute changes since last appointment, and no other complaints at this time.   Objective: AAO x3, NAD DP/PT pulses palpable bilaterally, CRT less than 3 seconds Thick hyperkeratotic lesions bilateral fifth digits.  Upon debridement no ongoing ulceration drainage or any signs of infection.  The nails continue be hypertrophic, dystrophic with yellow-brown discoloration but there is no open lesions there is no redness or drainage or any signs of infection to the toenail or callus sites.  No open lesions or pre-ulcerative lesions.  Adductovarus present bilateral fifth digits No pain with calf compression, swelling, warmth, erythema  Assessment: 59 year old male with symptomatic hyperkeratotic lesions  Plan: -All treatment options discussed with the patient including all alternatives, risks, complications.  -Debrided hyperkeratotic lesion x2 without any complications or bleeding.  Dispensed offloading pads.  Discussed shoe modifications.  Ultimately discussed surgical intervention symptoms continue if he wishes. -As a courtesy debride the nails x10 without complications or bleeding.  Continue with antifungal medicine. -Patient encouraged to call the office with any questions, concerns, change in symptoms.   Vivi Barrack DPM

## 2020-01-01 ENCOUNTER — Encounter: Payer: Self-pay | Admitting: Podiatry

## 2020-01-01 ENCOUNTER — Other Ambulatory Visit: Payer: Self-pay

## 2020-01-01 ENCOUNTER — Ambulatory Visit: Payer: 59 | Admitting: Podiatry

## 2020-01-01 VITALS — Temp 97.2°F

## 2020-01-01 DIAGNOSIS — M79675 Pain in left toe(s): Secondary | ICD-10-CM

## 2020-01-01 DIAGNOSIS — Q828 Other specified congenital malformations of skin: Secondary | ICD-10-CM

## 2020-01-01 DIAGNOSIS — B351 Tinea unguium: Secondary | ICD-10-CM | POA: Diagnosis not present

## 2020-01-01 DIAGNOSIS — M79674 Pain in right toe(s): Secondary | ICD-10-CM

## 2020-01-01 DIAGNOSIS — M205X9 Other deformities of toe(s) (acquired), unspecified foot: Secondary | ICD-10-CM | POA: Diagnosis not present

## 2020-01-02 NOTE — Progress Notes (Signed)
Subjective: 59 year old male presents the office today for concerns of pain to calluses on the fifth toes as well as for thick, elongated toenails that he cannot trim himself and causing irritation.  Denies any skin breakdown denies any redness or drainage and swelling. Denies any systemic complaints such as fevers, chills, nausea, vomiting. No acute changes since last appointment, and no other complaints at this time.   Objective: AAO x3, NAD DP/PT pulses palpable bilaterally, CRT less than 3 seconds Thick hyperkeratotic lesions bilateral fifth digits.  Adductovarus present fifth toes. Ulceration drainage or any signs of infection. Nails are hypertrophic, dystrophic, brittle, discolored, elongated 10. No surrounding redness or drainage. Tenderness nails 1-5 bilaterally. No other open lesions or pre-ulcerative lesions are identified today. No pain with calf compression, swelling, warmth, erythema  Assessment: 59 year old male with symptomatic hyperkeratotic lesions  Plan: -All treatment options discussed with the patient including all alternatives, risks, complications.  -Debrided hyperkeratotic lesion x2 without any complications or bleeding.  Dispensed offloading pads.  Discussed shoe modifications.  We discussed surgical intervention but he does not seem interested at this time. -Debrided the nails x10 without complications or bleeding.  Continue with antifungal medicine. -Patient encouraged to call the office with any questions, concerns, change in symptoms.   Vivi Barrack DPM

## 2020-04-02 ENCOUNTER — Other Ambulatory Visit: Payer: Self-pay

## 2020-04-02 ENCOUNTER — Ambulatory Visit: Payer: 59 | Admitting: Podiatry

## 2020-04-02 DIAGNOSIS — B351 Tinea unguium: Secondary | ICD-10-CM

## 2020-04-02 DIAGNOSIS — M79675 Pain in left toe(s): Secondary | ICD-10-CM | POA: Diagnosis not present

## 2020-04-02 DIAGNOSIS — Q828 Other specified congenital malformations of skin: Secondary | ICD-10-CM | POA: Diagnosis not present

## 2020-04-02 DIAGNOSIS — M79674 Pain in right toe(s): Secondary | ICD-10-CM | POA: Diagnosis not present

## 2020-04-07 NOTE — Progress Notes (Signed)
Subjective: 59 year old male presents the office today for concerns of pain to calluses on the fifth toes as well as for thick, elongated toenails that he cannot trim himself. Denies any redness or drainage and swelling. Denies any systemic complaints such as fevers, chills, nausea, vomiting. No acute changes since last appointment, and no other complaints at this time.   Objective: AAO x3, NAD DP/PT pulses palpable bilaterally, CRT less than 3 seconds Thick hyperkeratotic lesions bilateral fifth digits.  Adductovarus present fifth toes. Ulceration drainage or any signs of infection. Nails are hypertrophic, dystrophic, brittle, discolored, elongated 10. No surrounding redness or drainage. Tenderness nails 1-5 bilaterally. No other open lesions or pre-ulcerative lesions are identified today. No pain with calf compression, swelling, warmth, erythema  Assessment: 59 year old male with symptomatic hyperkeratotic lesions  Plan: -All treatment options discussed with the patient including all alternatives, risks, complications.  -Debrided hyperkeratotic lesion x2 without any complications or bleeding.  Dispensed offloading pads.  Discussed shoe modifications.  Wants to hold off on surgery.  -Debrided the nails x10 without complications or bleeding. -Patient encouraged to call the office with any questions, concerns, change in symptoms.   Vivi Barrack DPM

## 2020-07-10 ENCOUNTER — Ambulatory Visit: Payer: 59 | Admitting: Podiatry

## 2020-08-19 ENCOUNTER — Encounter: Payer: Self-pay | Admitting: Podiatry

## 2020-08-19 ENCOUNTER — Other Ambulatory Visit: Payer: Self-pay

## 2020-08-19 ENCOUNTER — Ambulatory Visit: Payer: 59 | Admitting: Podiatry

## 2020-08-19 ENCOUNTER — Ambulatory Visit (INDEPENDENT_AMBULATORY_CARE_PROVIDER_SITE_OTHER): Payer: 59

## 2020-08-19 DIAGNOSIS — M79672 Pain in left foot: Secondary | ICD-10-CM

## 2020-08-19 DIAGNOSIS — M79671 Pain in right foot: Secondary | ICD-10-CM

## 2020-08-19 DIAGNOSIS — M722 Plantar fascial fibromatosis: Secondary | ICD-10-CM

## 2020-08-19 MED ORDER — DICLOFENAC SODIUM 75 MG PO TBEC: 75.0000 mg | DELAYED_RELEASE_TABLET | Freq: Two times a day (BID) | ORAL | 2 refills | Status: DC

## 2020-08-19 NOTE — Patient Instructions (Signed)

## 2020-08-20 NOTE — Progress Notes (Signed)
Subjective:   Patient ID: Devin Guzman, male   DOB: 59 y.o.   MRN: [id number]   HPI Patient states she is developed a lot of pain in the plantar aspect of his left heel and its become gradually more bothersome for him and makes it hard to walk comfortably   ROS      Objective:  Physical Exam  Neurovascular status intact with inflammation pain plantar aspect left heel at the insertional point tendon into the calcaneus with inflammation fluid around the medial band     Assessment:  Acute plantar fasciitis left inflammation fluid of the medial band     Plan:  H&P reviewed condition x-rays discussed and today I did sterile prep and injected the plantar fascia 3 mg Kenalog 5 mg liken applied fascial brace gave instructions of physical therapy anti-inflammatories and reappoint to recheck and reevaluate  X-rays indicated spur but no indication stress fracture arthritis

## 2020-08-27 ENCOUNTER — Other Ambulatory Visit: Payer: Self-pay | Admitting: Podiatry

## 2020-08-27 DIAGNOSIS — M722 Plantar fascial fibromatosis: Secondary | ICD-10-CM

## 2020-09-16 ENCOUNTER — Ambulatory Visit: Payer: 59 | Admitting: Podiatry

## 2021-03-06 ENCOUNTER — Encounter: Payer: Self-pay | Admitting: Sports Medicine

## 2021-03-06 ENCOUNTER — Telehealth: Payer: Self-pay | Admitting: *Deleted

## 2021-03-06 ENCOUNTER — Ambulatory Visit: Payer: 59 | Admitting: Sports Medicine

## 2021-03-06 ENCOUNTER — Other Ambulatory Visit: Payer: Self-pay

## 2021-03-06 DIAGNOSIS — M722 Plantar fascial fibromatosis: Secondary | ICD-10-CM

## 2021-03-06 DIAGNOSIS — M79672 Pain in left foot: Secondary | ICD-10-CM

## 2021-03-06 DIAGNOSIS — M2141 Flat foot [pes planus] (acquired), right foot: Secondary | ICD-10-CM

## 2021-03-06 DIAGNOSIS — M2142 Flat foot [pes planus] (acquired), left foot: Secondary | ICD-10-CM

## 2021-03-06 MED ORDER — TRIAMCINOLONE ACETONIDE 10 MG/ML IJ SUSP
10.0000 mg | Freq: Once | INTRAMUSCULAR | Status: AC
  Administered 2021-03-06: 10 mg

## 2021-03-06 MED ORDER — MELOXICAM 15 MG PO TABS: 15.0000 mg | ORAL_TABLET | Freq: Every day | ORAL | 0 refills | Status: DC

## 2021-03-06 NOTE — Progress Notes (Signed)
Subjective: Devin Guzman is a 60 y.o. male patient presents to office with complaint of moderate heel pain on the left heel.  Patient reports that previous injection and plantar fascial brace did not work states that his pain is constant and has flared back up has difficulty with walking and standing long periods of time at work.  States that even when he tries to push or lift something heavy he feels pain immediately in his left heel.  No other pedal complaints noted.  There are no problems to display for this patient.   Current Outpatient Medications on File Prior to Visit  Medication Sig Dispense Refill  . ciclopirox (PENLAC) 8 % solution Apply topically at bedtime. Apply over nail and surrounding skin. Apply daily over previous coat. After seven (7) days, may remove with alcohol and continue cycle. 6.6 mL 0  . cyclobenzaprine (FLEXERIL) 10 MG tablet Take 0.5-1 tablets (5-10 mg total) by mouth 3 (three) times daily as needed for muscle spasms. 30 tablet 0  . diclofenac (VOLTAREN) 75 MG EC tablet Take 1 tablet (75 mg total) by mouth 2 (two) times daily. 50 tablet 2  . NONFORMULARY OR COMPOUNDED ITEM [facility]:  Antifungal topical - Terbinafine 3%, Fluconazole 2%, Tea Tree oil 5%, Urea 10%, Ibuprofen 2%, in #25ml DMSO suspension. Apply to affected toenail(s) daily at bedtime or twice daily. 30 each 11   No current facility-administered medications on file prior to visit.    Allergies  Allergen Reactions  . Codeine     Objective: Physical Exam General: The patient is alert and oriented x3 in no acute distress.  Dermatology: Skin is warm, dry and supple bilateral lower extremities. Nails 1-10 mildly elongated and thickened consistent with onychomycosis.  There is mild reactive keratosis plantar central heel left foot.  Minimal reactive keratosis plantar central heel left.  There is no erythema, edema, no eccymosis, no open lesions present. Integument is otherwise  unremarkable.  Vascular: Dorsalis Pedis pulse and Posterior Tibial pulse are 2/4 bilateral. Capillary fill time is immediate to all digits.  Neurological: Grossly intact to light touch bilateral.  Musculoskeletal: Tenderness to palpation at the medial and plantar central calcaneal tubercale and through the insertion of the plantar fascia on the left foot. No pain with compression of calcaneus bilateral.  Pes planus foot type.  Strength 5/5 in all groups bilateral.   Gait: Unassisted, Antalgic avoid weight on left heel    Assessment and Plan: Problem List Items Addressed This Visit   None   Visit Diagnoses    Plantar fasciitis of left foot    -  Primary   Pain of left heel       Pes planus of both feet          -Complete examination performed.  -Previous xrays reviewed -Discussed with patient in detail the condition of plantar fasciitis, how this occurs and general treatment options. Explained both conservative and surgical treatments.  -After oral consent and aseptic prep, injected a mixture containing 1 ml of 2%  plain lidocaine, 1 ml 0.5% plain marcaine, 0.5 ml of kenalog 10 and 0.5 ml of dexamethasone phosphate into left heel plantarly. Post-injection care discussed with patient.  -Rx Meloxicam -At no additional charge mechanically debrided keratosis using a 15 blade at the plantar central heel without incident -Recommended good supportive shoes and advised use of OTC heel lifts as dispensed visit -Explained and dispensed to patient daily stretching exercises. -Recommend patient to ice affected area 1-2x daily. -Patient  to return to office in 4 weeks for follow up or sooner if problems or questions arise.  Landis Martins, DPM

## 2021-03-06 NOTE — Telephone Encounter (Signed)
Patient has been seen at [facility] in the past. He is having foot pain at this time and is already established with a foot clinic. Advised calling foot clinic for appointment at this time and establishing with a new pcp. He will call when ready to see a new pcp.

## 2021-03-06 NOTE — Patient Instructions (Signed)
Plantar Fasciitis Rehab Ask your health care provider which exercises are safe for you. Do exercises exactly as told by your health care provider and adjust them as directed. It is normal to feel mild stretching, pulling, tightness, or discomfort as you do these exercises. Stop right away if you feel sudden pain or your pain gets worse. Do not begin these exercises until told by your health care provider. Stretching and range-of-motion exercises These exercises warm up your muscles and joints and improve the movement and flexibility of your foot. These exercises also help to relieve pain. Plantar fascia stretch 1. Sit with your left / right leg crossed over your opposite knee. 2. Hold your heel with one hand with that thumb near your arch. With your other hand, hold your toes and gently pull them back toward the top of your foot. You should feel a stretch on the base (bottom) of your toes, or the bottom of your foot (plantar fascia), or both. 3. Hold this stretch for__________ seconds. 4. Slowly release your toes and return to the starting position. Repeat __________ times. Complete this exercise __________ times a day.   Gastrocnemius stretch, standing This exercise is also called a calf (gastroc) stretch. It stretches the muscles in the back of the upper calf. 1. Stand with your hands against a wall. 2. Extend your left / right leg behind you, and bend your front knee slightly. 3. Keeping your heels on the floor, your toes facing forward, and your back knee straight, shift your weight toward the wall. Do not arch your back. You should feel a gentle stretch in your upper calf. 4. Hold this position for __________ seconds. Repeat __________ times. Complete this exercise __________ times a day.   Soleus stretch, standing This exercise is also called a calf (soleus) stretch. It stretches the muscles in the back of the lower calf. 1. Stand with your hands against a wall. 2. Extend your left / right  leg behind you, and bend your front knee slightly. 3. Keeping your heels on the floor and your toes facing forward, bend your back knee and shift your weight slightly over your back leg. You should feel a gentle stretch deep in your lower calf. 4. Hold this position for __________ seconds. Repeat __________ times. Complete this exercise __________ times a day. Gastroc and soleus stretch, standing step This exercise stretches the muscles in the back of the lower leg. These muscles are in the upper calf (gastrocnemius) and the lower calf (soleus). 1. Stand with the ball of your left / right foot on the front of a step. The ball of your foot is on the walking surface, right under your toes. 2. Keep your other foot firmly on the same step. 3. Hold on to the wall or a railing for balance. 4. Slowly lift your other foot, allowing your body weight to press your heel down over the edge of the front of the step. Keep knee straight and unbent. You should feel a stretch in your calf. 5. Hold this position for __________ seconds. 6. Return both feet to the step. 7. Repeat this exercise with a slight bend in your left / right knee. Repeat __________ times with your left / right knee straight and __________ times with your left / right knee bent. Complete this exercise __________ times a day. Balance exercise This exercise builds your balance and strength control of your arch to help take pressure off your plantar fascia. Single leg stand If this exercise   is too easy, you can try it with your eyes closed or while standing on a pillow. 1. Without shoes, stand near a railing or in a doorway. You may hold on to the railing or door frame as needed. 2. Stand on your left / right foot. Keep your big toe down on the floor and lift the arch of your foot. You should feel a stretch across the bottom of your foot and your arch. Do not let your foot roll inward. 3. Hold this position for __________ seconds. Repeat  __________ times. Complete this exercise __________ times a day. This information is not intended to replace advice given to you by your health care provider. Make sure you discuss any questions you have with your health care provider. Document Revised: 07/11/2020 Document Reviewed: 07/11/2020 Elsevier Patient Education  2021 [employer] Inc.  

## 2021-04-02 ENCOUNTER — Other Ambulatory Visit: Payer: Self-pay | Admitting: Sports Medicine

## 2021-04-02 NOTE — Telephone Encounter (Signed)
Please advise 

## 2021-04-30 ENCOUNTER — Other Ambulatory Visit: Payer: Self-pay

## 2021-04-30 ENCOUNTER — Encounter: Payer: Self-pay | Admitting: Podiatry

## 2021-04-30 ENCOUNTER — Ambulatory Visit: Payer: 59 | Admitting: Podiatry

## 2021-04-30 DIAGNOSIS — M722 Plantar fascial fibromatosis: Secondary | ICD-10-CM

## 2021-04-30 MED ORDER — TRIAMCINOLONE ACETONIDE 10 MG/ML IJ SUSP
10.0000 mg | Freq: Once | INTRAMUSCULAR | Status: AC
  Administered 2021-04-30: 10 mg

## 2021-05-01 NOTE — Progress Notes (Signed)
Subjective:   Patient ID: Devin Guzman, male   DOB: 60 y.o.   MRN: [id number]   HPI Patient presents stating he is still having a lot of pain in his left arch and towards his heel and states that he does not feel like the oral medicine and the previous injection was helpful   ROS      Objective:  Physical Exam  Neurovascular status intact with exquisite discomfort plantar aspect left heel and arch with inflammation noted of the medial band with patient having trouble with weightbearing and trouble with activity levels and work     Assessment:  Acute plantar fasciitis left that has not responded conservative care     Plan:  H&P educated explained this to him did do sterile prep and reinjected the fascia distal to its insertion calcaneus 3 mg Kenalog 5 mg Xylocaine and went ahead today and applied air fracture walker with instructions on usage for the next 3 to 4 weeks.  Reappoint 1 month or earlier if needed

## 2021-05-09 ENCOUNTER — Other Ambulatory Visit: Payer: Self-pay | Admitting: Sports Medicine

## 2021-05-12 NOTE — Telephone Encounter (Signed)
Please advise 

## 2021-05-28 ENCOUNTER — Other Ambulatory Visit: Payer: Self-pay

## 2021-05-28 ENCOUNTER — Encounter: Payer: Self-pay | Admitting: Podiatry

## 2021-05-28 ENCOUNTER — Ambulatory Visit: Payer: 59 | Admitting: Podiatry

## 2021-05-28 DIAGNOSIS — M722 Plantar fascial fibromatosis: Secondary | ICD-10-CM

## 2021-05-28 NOTE — Progress Notes (Signed)
Subjective:   Patient ID: Devin Guzman, male   DOB: 60 y.o.   MRN: [id number]   HPI Patient presents stating that his heel is improving he is wearing his boot is much as he can and is wearing it at work currently and not all the time   ROS      Objective:  Physical Exam  Neuro vascular status intact with discomfort in the left plantar fascial is improved with pain still noted only upon deep palpation      Assessment:  Acute Planter fasciitis reduced with immobilization medication     Plan:  H&P reviewed condition recommended the continuation of boot usage but gradual reduction over the next couple weeks along with insert usage and shoe gear modifications.  If symptoms were to get worse we will consider other treatments for this but at this point I am relatively optimistic this will be the end of the symptoms

## 2021-07-18 ENCOUNTER — Telehealth: Payer: Self-pay | Admitting: *Deleted

## 2021-07-18 NOTE — Telephone Encounter (Signed)
Patient is calling with concerns about his B/  l foot pain and numbness in toes,has been hurting for a month but has gotten worse.  Patient has been scheduled for 07/22/21@2 :15 w/ Dr Sharen Heck.

## 2021-07-22 ENCOUNTER — Ambulatory Visit: Payer: 59 | Admitting: Podiatry

## 2021-07-22 ENCOUNTER — Encounter: Payer: Self-pay | Admitting: Podiatry

## 2021-07-22 ENCOUNTER — Other Ambulatory Visit: Payer: Self-pay

## 2021-07-22 DIAGNOSIS — G629 Polyneuropathy, unspecified: Secondary | ICD-10-CM | POA: Diagnosis not present

## 2021-07-22 DIAGNOSIS — B351 Tinea unguium: Secondary | ICD-10-CM | POA: Diagnosis not present

## 2021-07-22 NOTE — Progress Notes (Signed)
  Subjective:  Patient ID: Devin Guzman, male    DOB: [birth year],   MRN: [id number]  Chief Complaint  Patient presents with   Numbness    Right foot numbness when laying down only.    Nail Problem    RFC Nail trim bilateral nails 1-5.     60 y.o. male presents for new concern for foot numbness that has been present for about two weeks. Relates it only happens when he is laying down. States occasionally it will tingle and burn. Also requesting to have his nails trimmed today. Denies any treatments. Relates his plantar fasciitis has improved in both feet. Does not currently have a PCP and last A1c was 10 years ago . Denies any other pedal complaints. Denies n/v/f/c.   History reviewed. No pertinent past medical history.  Objective:  Physical Exam: Vascular: DP/PT pulses 2/4 bilateral. CFT <3 seconds. Normal hair growth on digits. No edema.  Skin. No lacerations or abrasions bilateral feet. Nails 1-5 are thickened discolored and elongated with subungual debris.   Musculoskeletal: MMT 5/5 bilateral lower extremities in DF, PF, Inversion and Eversion. Deceased ROM in DF of ankle joint.  Neurological: Sensation intact to light touch. Protective sensation diminished in 1-2 distal toes.   Assessment:   1. Dermatophytosis of nail   2. Neuropathy      Plan:  Patient was evaluated and treated and all questions answered. Discussed neuropathy and etiology as well as treatment with patient.  -Discussed and educated patient on  foot care, especially with regards to the vascular, neurological and musculoskeletal systems.  -Discussed supportive shoes at all times and checking feet regularly.  -Follow-up with PCP for back evaluation and work-up for diabetes.  -Nails debrided today 1-5 b/l without incident.  -Patient to return  as needed.    Louann Sjogren, DPM

## 2021-09-08 ENCOUNTER — Ambulatory Visit (INDEPENDENT_AMBULATORY_CARE_PROVIDER_SITE_OTHER): Payer: 59

## 2021-09-08 ENCOUNTER — Telehealth (HOSPITAL_COMMUNITY): Payer: Self-pay | Admitting: Physician Assistant

## 2021-09-08 ENCOUNTER — Ambulatory Visit (HOSPITAL_BASED_OUTPATIENT_CLINIC_OR_DEPARTMENT_OTHER)
Admit: 2021-09-08 | Discharge: 2021-09-08 | Disposition: A | Discharge status: Home / Self Care | Payer: 59 | Attending: Physician Assistant | Admitting: Physician Assistant

## 2021-09-08 ENCOUNTER — Ambulatory Visit (HOSPITAL_BASED_OUTPATIENT_CLINIC_OR_DEPARTMENT_OTHER)
Admission: RE | Admit: 2021-09-08 | Discharge: 2021-09-08 | Disposition: A | Discharge status: Home / Self Care | Payer: 59 | Source: Ambulatory Visit | Attending: Physician Assistant | Admitting: Physician Assistant

## 2021-09-08 ENCOUNTER — Ambulatory Visit (HOSPITAL_COMMUNITY)
Admission: EM | Admit: 2021-09-08 | Discharge: 2021-09-08 | Disposition: A | Discharge status: Home / Self Care | Payer: 59 | Attending: Physician Assistant | Admitting: Physician Assistant

## 2021-09-08 ENCOUNTER — Encounter (HOSPITAL_COMMUNITY): Payer: Self-pay

## 2021-09-08 ENCOUNTER — Other Ambulatory Visit: Payer: Self-pay

## 2021-09-08 DIAGNOSIS — R030 Elevated blood-pressure reading, without diagnosis of hypertension: Secondary | ICD-10-CM | POA: Diagnosis not present

## 2021-09-08 DIAGNOSIS — I70229 Atherosclerosis of native arteries of extremities with rest pain, unspecified extremity: Secondary | ICD-10-CM

## 2021-09-08 DIAGNOSIS — I739 Peripheral vascular disease, unspecified: Secondary | ICD-10-CM | POA: Diagnosis not present

## 2021-09-08 DIAGNOSIS — M7989 Other specified soft tissue disorders: Secondary | ICD-10-CM | POA: Diagnosis not present

## 2021-09-08 DIAGNOSIS — M79671 Pain in right foot: Secondary | ICD-10-CM | POA: Diagnosis not present

## 2021-09-08 DIAGNOSIS — R2241 Localized swelling, mass and lump, right lower limb: Secondary | ICD-10-CM | POA: Insufficient documentation

## 2021-09-08 NOTE — Progress Notes (Signed)
Lower extremity venous RT study completed. Called incidental findings to Dorann Ou, PA at [facility]. ABI w/ TBI and lower extremity arterial bilateral studies ordered and completed.  Critical ABI results called to [place] [employer], [facility].   Jean Rosenthal, RDMS, RVT

## 2021-09-08 NOTE — ED Triage Notes (Signed)
Pt presents with R foot swelling since yesterday in the evening. States he feel top part of his foot is numb.

## 2021-09-08 NOTE — Telephone Encounter (Signed)
Received call from vascular department.  Patient was negative for DVT but was noted to have likely peripheral arterial disease.  Requested orders for ABI and arterial duplex.  Orders were placed as requested.

## 2021-09-08 NOTE — ED Notes (Signed)
Appointment schedule for DVT ultrasound.

## 2021-09-08 NOTE — Telephone Encounter (Signed)
Received telephone call with ABI and arterial duplex results.  Results were critical and so discussed this with patient while he was at vascular department via telephone with recommendation to go to the emergency room due to concern for ischemia.  Patient expressed understanding and reports that he will go within the next day but had something to take care of first and so was not going to go directly to the ER.  Discussed multiple times that this could result in permanent damage including amputation to which he expressed understanding and reports he will go as soon as possible.  All questions answered to patient satisfaction.

## 2021-09-08 NOTE — Discharge Instructions (Signed)
Your x-ray was normal with no evidence of a fracture.  We are going to get an ultrasound to rule out a blood clot.  I will call you if we need to arrange any treatment or do anything different.  In the meantime, use conservative treatment including elevation, compression, avoiding salt.  We will try to establish you with a primary care provider.  If you develop any worsening symptoms including chest pain, heart racing, shortness of breath, leg swelling that is worsening you need to be seen immediately.

## 2021-09-08 NOTE — ED Provider Notes (Signed)
[facility]    CSN: [id number] Arrival date & time: 09/08/21  [id number]      History   Chief Complaint Chief Complaint  Patient presents with   Foot Swelling    HPI SHERRI BALBUENA is a 60 y.o. male.   Patient presents today with a 3-day history of right foot swelling.  He denies any known injury or increase in activity prior to symptom onset.  Denies any changes in footwear.  Does report that he hit the lateral portion of his right foot on a nightstand when he was sleeping but this occurred several weeks ago and he was not experiencing pain or swelling until more recently.  He does have a history of plantar fasciitis as well as onychomycosis and is followed by podiatry.  Reports current symptoms are not similar to previous episodes of this condition.  He denies any numbness or paresthesias but does report that he will occasionally have numbness on the dorsal portion of his right foot depending on his position when he is sleeping; this resolves quickly with changing positions.  He denies history of diabetes, chemotherapy use, heavy metal exposure.  He denies history of VTE event, recent hospitalization, immobilization, surgical procedure.  Denies any recent COVID-19 infection.  Does not take exogenous hormones.  Denies any chest pain, shortness of breath, palpitations, hemoptysis, cough.   History reviewed. No pertinent past medical history.  There are no problems to display for this patient.   History reviewed. No pertinent surgical history.     Home Medications    Prior to Admission medications   Medication Sig Start Date End Date Taking? Authorizing Provider  meloxicam (MOBIC) 15 MG tablet TAKE 1 TABLET (15 MG TOTAL) BY MOUTH DAILY. 05/12/21   Landis Martins, DPM  NONFORMULARY OR COMPOUNDED ITEM [facility]:  Antifungal topical - Terbinafine 3%, Fluconazole 2%, Tea Tree oil 5%, Urea 10%, Ibuprofen 2%, in #39ml DMSO suspension. Apply to affected toenail(s) daily at  bedtime or twice daily. 07/25/19   Trula Slade, DPM    Family History Family History  Problem Relation Age of Onset   Hypertension Mother     Social History Social History   Tobacco Use   Smoking status: Every Day    Packs/day: 0.25    Types: Cigarettes   Smokeless tobacco: Never  Vaping Use   Vaping Use: Never used  Substance Use Topics   Alcohol use: Yes    Alcohol/week: 3.0 standard drinks    Types: 3 Cans of beer per week   Drug use: No     Allergies   Codeine   Review of Systems Review of Systems  Constitutional:  Positive for activity change. Negative for appetite change, fatigue and fever.  Respiratory:  Negative for cough and shortness of breath.   Cardiovascular:  Positive for leg swelling (right foot). Negative for chest pain and palpitations.  Gastrointestinal:  Negative for abdominal pain, diarrhea, nausea and vomiting.  Musculoskeletal:  Positive for arthralgias, gait problem and joint swelling. Negative for myalgias.  Neurological:  Positive for numbness (positional). Negative for dizziness, weakness, light-headedness and headaches.    Physical Exam Triage Vital Signs ED Triage Vitals  Enc Vitals Group     BP 09/08/21 0854 (!) 167/90     Pulse Rate 09/08/21 0853 66     Resp 09/08/21 0853 17     Temp 09/08/21 0853 98.2 F (36.8 C)     Temp Source 09/08/21 0853 Oral     SpO2  09/08/21 0853 98 %     Weight --      Height --      Head Circumference --      Peak Flow --      Pain Score 09/08/21 0852 0     Pain Loc --      Pain Edu? --      Excl. in GC? --    No data found.  Updated Vital Signs BP (!) 167/90   Pulse 66   Temp 98.2 F (36.8 C) (Oral)   Resp 17   SpO2 98%   Visual Acuity Right Eye Distance:   Left Eye Distance:   Bilateral Distance:    Right Eye Near:   Left Eye Near:    Bilateral Near:     Physical Exam Vitals reviewed.  Constitutional:      General: He is awake.     Appearance: Normal appearance. He is  well-developed. He is not ill-appearing.     Comments: Very pleasant male appears stated age in no acute distress sitting comfortably in exam room  HENT:     Head: Normocephalic and atraumatic.     Mouth/Throat:     Pharynx: No oropharyngeal exudate, posterior oropharyngeal erythema or uvula swelling.  Cardiovascular:     Rate and Rhythm: Normal rate and regular rhythm.     Pulses:          Posterior tibial pulses are 2+ on the right side and 2+ on the left side.     Heart sounds: Normal heart sounds, S1 normal and S2 normal. No murmur heard.    Comments: 2+ pitting edema to ankle on the right.  Negative Homans' sign on right. Pulmonary:     Effort: Pulmonary effort is normal.     Breath sounds: Normal breath sounds. No stridor. No wheezing, rhonchi or rales.     Comments: Clear to auscultation bilaterally Abdominal:     Palpations: Abdomen is soft.     Tenderness: There is no abdominal tenderness.  Musculoskeletal:     Right lower leg: 2+ Edema present.     Left lower leg: No edema.     Right foot: Normal range of motion. No deformity.     Left foot: Normal range of motion. No deformity.  Feet:     Right foot:     Protective Sensation: 10 sites tested.  10 sites sensed.     Skin integrity: Dry skin present.     Toenail Condition: Right toenails are abnormally thick.     Left foot:     Protective Sensation: 10 sites tested.  10 sites sensed.     Skin integrity: Dry skin present.     Toenail Condition: Left toenails are abnormally thick.     Comments: Feet neurovascularly intact.  Normal active range of motion at ankle and phalanges.  Mild tenderness palpation over right fifth metatarsal without deformity. Neurological:     Mental Status: He is alert.  Psychiatric:        Behavior: Behavior is cooperative.     UC Treatments / Results  Labs (all labs ordered are listed, but only abnormal results are displayed) Labs Reviewed - No data to display  EKG   Radiology DG Foot  Complete Right  Result Date: 09/08/2021 CLINICAL DATA:  Pain EXAM: RIGHT FOOT COMPLETE - 3+ VIEW COMPARISON:  Right foot dated July 30, 2017 FINDINGS: No acute fracture or dislocation. Mild degenerative changes of the first MTP joint and IP  joints. Small calcaneal spur. Soft tissues are unremarkable. IMPRESSION: No acute osseous abnormality. Electronically Signed   By: Yetta Glassman M.D.   On: 09/08/2021 09:19    Procedures Procedures (including critical care time)  Medications Ordered in UC Medications - No data to display  Initial Impression / Assessment and Plan / UC Course  I have reviewed the triage vital signs and the nursing notes.  Pertinent labs & imaging results that were available during my care of the patient were reviewed by me and considered in my medical decision making (see chart for details).     X-ray obtained given mild tenderness to patient over lateral right foot with possible injury several weeks ago showed no osseous abnormalities.  Given unilateral swelling of right lower extremity will obtain ultrasound to rule out DVT although suspicion for this as patient does not have any significant risk factors.  Recommended to use conservative treatment measures including elevation, compression, sodium avoidance for symptom relief.  If symptoms persist will consider low-dose diuretic in the future but given short duration of symptoms will defer that for the time being.  Patient does not currently have a PCP so we will try to establish him with 1 for follow-up of leg swelling as well as elevated blood pressure reading.  Discussed alarm symptoms that warrant emergent evaluation.  Strict return precautions given to which he expressed understanding.  Blood pressure was elevated today.  Patient denies any chest pain, shortness of breath, headache, dizziness, vision changes.  Denies formal diagnosis of hypertension has not taken medication in the past.  Patient does believe that he  may have increased sodium consumption recently which could be contributing to swelling.  He was instructed to avoid sodium, caffeine, decongestants, NSAIDs.  He is to monitor his blood pressure at home and if this is persistently above 140/90 he needs to be reevaluated to consider medication.  He does not have a PCP so we will try to establish him with 1 via PCP assistance.  He is to follow-up with our clinic or PCP within 1 to 2 weeks to ensure normalization of blood pressure unless needed to be seen sooner.  Final Clinical Impressions(s) / UC Diagnoses   Final diagnoses:  Localized swelling of right foot  Right leg swelling  Elevated blood pressure reading     Discharge Instructions      Your x-ray was normal with no evidence of a fracture.  We are going to get an ultrasound to rule out a blood clot.  I will call you if we need to arrange any treatment or do anything different.  In the meantime, use conservative treatment including elevation, compression, avoiding salt.  We will try to establish you with a primary care provider.  If you develop any worsening symptoms including chest pain, heart racing, shortness of breath, leg swelling that is worsening you need to be seen immediately.     ED Prescriptions   None    PDMP not reviewed this encounter.   Terrilee Croak, PA-C 09/08/21 [id number]

## 2021-09-11 ENCOUNTER — Encounter (HOSPITAL_COMMUNITY): Payer: Self-pay

## 2021-09-11 ENCOUNTER — Other Ambulatory Visit: Payer: Self-pay

## 2021-09-11 ENCOUNTER — Ambulatory Visit (HOSPITAL_COMMUNITY)
Admission: RE | Admit: 2021-09-11 | Discharge: 2021-09-11 | Disposition: A | Discharge status: Home / Self Care | Payer: 59 | Source: Ambulatory Visit | Attending: Emergency Medicine | Admitting: Emergency Medicine

## 2021-09-11 VITALS — BP 172/89 | HR 52 | Temp 97.8°F | Resp 22

## 2021-09-11 DIAGNOSIS — R2241 Localized swelling, mass and lump, right lower limb: Secondary | ICD-10-CM | POA: Diagnosis not present

## 2021-09-11 NOTE — ED Triage Notes (Signed)
Seen 09/08/2021.  Patient reports there is no improvement in right foot  .  Still reports numbness in toes when lying down at night.  Pain with walking.  Patient reports complaint is the same.    Patient and family member have reported here today thinking there would be a stent placed

## 2021-09-11 NOTE — ED Provider Notes (Signed)
[facility]    CSN: [id number] Arrival date & time: 09/11/21  1119      History   Chief Complaint Chief Complaint  Patient presents with   Appointment    11:00   Foot Pain    HPI Devin Guzman is a 60 y.o. male.   Patient presents with right ankle and foot swelling for 5 days.  Endorses intermittent periods of numbness and tingling are occurring predominantly while sleeping.  Symptoms are resolved after changing positions.  Endorses that pain is typically only felt after long periods of walking and resolved with rest.  Range of motion of foot and ankle is intact.  Has not attempted to treatment.  Patient seen in urgent care on 08/19/2021 for same symptoms.  Endorses that symptoms have persistent but not worsening.  X-ray completed at that time negative for fracture.  Patient for ultrasound and ABI for rule out of DVT, negative for DVT but critical right sided ischemia seen.  Patient notified of results on 09/08/2021, notified by provider to go to nearest emergency department, refused at that time.  Patient has return to urgent care.   History reviewed. No pertinent past medical history.  There are no problems to display for this patient.   History reviewed. No pertinent surgical history.     Home Medications    Prior to Admission medications   Medication Sig Start Date End Date Taking? Authorizing Provider  meloxicam (MOBIC) 15 MG tablet TAKE 1 TABLET (15 MG TOTAL) BY MOUTH DAILY. 05/12/21   Asencion Islam, DPM  NONFORMULARY OR COMPOUNDED ITEM  [facility]:  Antifungal topical - Terbinafine 3%, Fluconazole 2%, Tea Tree oil 5%, Urea 10%, Ibuprofen 2%, in #35ml DMSO suspension. Apply to affected toenail(s) daily at bedtime or twice daily. 07/25/19   Vivi Barrack, DPM    Family History Family History  Problem Relation Age of Onset   Hypertension Mother     Social History Social History   Tobacco Use   Smoking status: Every Day    Packs/day: 0.25     Types: Cigarettes   Smokeless tobacco: Never  Vaping Use   Vaping Use: Never used  Substance Use Topics   Alcohol use: Yes    Alcohol/week: 3.0 standard drinks    Types: 3 Cans of beer per week   Drug use: No     Allergies   Codeine   Review of Systems Review of Systems  Constitutional: Negative.   Respiratory: Negative.    Cardiovascular: Negative.   Musculoskeletal:  Positive for arthralgias and gait problem. Negative for back pain, joint swelling, myalgias, neck pain and neck stiffness.  Skin: Negative.   Neurological:  Positive for numbness. Negative for dizziness, tremors, seizures, syncope, facial asymmetry, weakness, light-headedness and headaches.    Physical Exam Triage Vital Signs ED Triage Vitals  Enc Vitals Group     BP 09/11/21 1144 (!) 172/89     Pulse Rate 09/11/21 1144 (!) 52     Resp 09/11/21 1144 (!) 22     Temp 09/11/21 1144 97.8 F (36.6 C)     Temp Source 09/11/21 1144 Oral     SpO2 09/11/21 1144 95 %     Weight --      Height --      Head Circumference --      Peak Flow --      Pain Score 09/11/21 1141 2     Pain Loc --      Pain Edu? --  Excl. in GC? --    No data found.  Updated Vital Signs BP (!) 172/89 (BP Location: Right Arm) Comment (BP Location): large cuff  Pulse (!) 52   Temp 97.8 F (36.6 C) (Oral)   Resp (!) 22   SpO2 95%   Visual Acuity Right Eye Distance:   Left Eye Distance:   Bilateral Distance:    Right Eye Near:   Left Eye Near:    Bilateral Near:     Physical Exam Constitutional:      Appearance: Normal appearance. He is normal weight.  HENT:     Head: Normocephalic.  Cardiovascular:     Pulses:          Dorsalis pedis pulses are 1+ on the right side.  Musculoskeletal:     Right lower leg: 3+ Edema present.     Right foot: Normal range of motion.  Feet:     Right foot:     Protective Sensation: 10 sites tested.  10 sites sensed.     Skin integrity: Dry skin present.  Neurological:      General: No focal deficit present.     Mental Status: He is alert and oriented to person, place, and time. Mental status is at baseline.  Psychiatric:        Mood and Affect: Mood normal.        Behavior: Behavior normal.     UC Treatments / Results  Labs (all labs ordered are listed, but only abnormal results are displayed) Labs Reviewed - No data to display  EKG   Radiology No results found.  Procedures Procedures (including critical care time)  Medications Ordered in UC Medications - No data to display  Initial Impression / Assessment and Plan / UC Course  I have reviewed the triage vital signs and the nursing notes.  Pertinent labs & imaging results that were available during my care of the patient were reviewed by me and considered in my medical decision making (see chart for details).  Localized swelling of right foot  Encouraged patient to go be seen at the nearest emergency department for further evaluation of critical ischemia, reviewed that findings on exam 3 days ago were critical , accompanying visitor  endorses that "there is no guarantee that treatment will be done in the emergency department or hospital and they do not want to waste time" , requesting outpatient referral, discussed with patient and accompanying visitor  that it may take weeks to be seen in the outpatient setting and that symptoms could continue to worsen causing tissue death, verbalized understanding, stressed that evaluation needs to be done urgently, refusing to go to emergency department, internal referral placed for vascular surgery, given patient strict return precautions at any point if symptoms begin to worsen to go to nearest emergency department Final Clinical Impressions(s) / UC Diagnoses   Final diagnoses:  Localized swelling of right foot     Discharge Instructions      It has been recommended that she go to the hospital for further evaluation of the obstruction in your ankle, it  is always your choice whether you choose to receive medical care not  The ultrasound of your blood flow in your right ankle on 09/08/21 which showed a critical decrease in blood flow that can cause loss of oxygen to the tissue resulting in the tissue to die   An internal referral has been placed for you to vascular surgery, they will reach out to you to set  up an appointment  At any point if your symptoms worsen please go to the nearest emergency department for further evaluation   ED Prescriptions   None    PDMP not reviewed this encounter.   Hans Eden, NP 09/11/21 1451

## 2021-09-11 NOTE — Discharge Instructions (Signed)
It has been recommended that she go to the hospital for further evaluation of the obstruction in your ankle, it is always your choice whether you choose to receive medical care not  The ultrasound of your blood flow in your right ankle on 09/08/21 which showed a critical decrease in blood flow that can cause loss of oxygen to the tissue resulting in the tissue to die   An internal referral has been placed for you to vascular surgery, they will reach out to you to set up an appointment  At any point if your symptoms worsen please go to the nearest emergency department for further evaluation

## 2021-09-11 NOTE — ED Notes (Signed)
Sister present for discharge instructions, patient states understanding.  Stressed the importance of going to ED .  Stated there is the chance of loosing foot if he does not go to ED now

## 2021-09-15 NOTE — Progress Notes (Signed)
[facility] [place]  ASSESSMENT / PLAN: Devin Guzman is a 60 y.o. male with atherosclerosis of native arteries of bilateral lower extremites (R>L) causing ischemic rest pain.  Patient counseled patients with chronic limb threatening ischemia have an annual risk of cardiovascular mortality of 25% and a high risk of amputation.   Recommend the following which can slow the progression of atherosclerosis and reduce the risk of major adverse cardiac / limb events:  Complete cessation from all tobacco products. Blood glucose control with goal A1c < 7%. Blood pressure control with goal blood pressure < 140/90 mmHg. Lipid reduction therapy with goal LDL-C <100 mg/dL (<70 if symptomatic from PAD).  Aspirin 81mg  PO QD.  Atorvastatin 40-80mg  PO QD (or other "high intensity" statin therapy).  Plan CT angiogram of the abdomen pelvis with runoff to the toes to evaluate likely aortoiliac occlusive disease.  I will call the patient with the results and plan a intervention based on the findings.  CHIEF COMPLAINT: Right foot pain  HISTORY OF PRESENT ILLNESS: Devin Guzman is a 60 y.o. male who presents to clinic for evaluation of right foot pain and swelling.  The patient reports a fairly classic history of ischemic rest pain.  He does not walk fast or far enough to claudicate, which I suspect is a result of slowly deteriorating arterial disease.  He reports over the past several weeks he has developed constant pain in his right foot which is alleviated by dependency.  He has to sleep with his foot hanging off the bed for relief.  The patient reports no ulcers on either foot.  He is a long-term smoker.  He gets his care through the [place].  He reports no medical problems, but I suspect he has not followed up with his primary physician for health maintenance.  Past medical history: Patient denies Surgical history: Patient denies  Family History  Problem Relation Age of Onset    Hypertension Mother     Social History   Socioeconomic History   Marital status: Single    Spouse name: Not on file   Number of children: Not on file   Years of education: Not on file   Highest education level: Not on file  Occupational History   Not on file  Tobacco Use   Smoking status: Every Day    Packs/day: 0.50    Types: Cigarettes   Smokeless tobacco: Never  Vaping Use   Vaping Use: Never used  Substance and Sexual Activity   Alcohol use: Yes    Alcohol/week: 3.0 standard drinks    Types: 3 Cans of beer per week   Drug use: No   Sexual activity: Not on file  Other Topics Concern   Not on file  Social History Narrative   Not on file   Social Determinants of Health   Financial Resource Strain: Not on file  Food Insecurity: Not on file  Transportation Needs: Not on file  Physical Activity: Not on file  Stress: Not on file  Social Connections: Not on file  Intimate Partner Violence: Not on file    Allergies  Allergen Reactions   Codeine     No current outpatient medications on file.   No current facility-administered medications for this visit.    REVIEW OF SYSTEMS:  [X]  denotes positive finding, [ ]  denotes negative finding Cardiac  Comments:  Chest pain or chest pressure:    Shortness of breath upon exertion:    Short of  breath when lying flat:    Irregular heart rhythm:        Vascular    Pain in calf, thigh, or hip brought on by ambulation:    Pain in feet at night that wakes you up from your sleep:  x   Blood clot in your veins:    Leg swelling:         Pulmonary    Oxygen at home:    Productive cough:     Wheezing:         Neurologic    Sudden weakness in arms or legs:     Sudden numbness in arms or legs:     Sudden onset of difficulty speaking or slurred speech:    Temporary loss of vision in one eye:     Problems with dizziness:         Gastrointestinal    Blood in stool:     Vomited blood:         Genitourinary    Burning  when urinating:     Blood in urine:        Psychiatric    Major depression:         Hematologic    Bleeding problems:    Problems with blood clotting too easily:        Skin    Rashes or ulcers:        Constitutional    Fever or chills:      PHYSICAL EXAM Vitals:   09/16/21 1129  BP: 132/77  Pulse: (!) 55  Resp: 20  Temp: 99.1 F (37.3 C)  SpO2: 100%  Weight: 221 lb (100.2 kg)  Height: 5\' 9"  (1.753 m)    Constitutional: Chronically ill appearing. no distress. Appears well nourished.  Neurologic: CN intact. no focal findings. no sensory loss. Psychiatric:  Mood and affect symmetric and appropriate. Eyes:  No icterus. No conjunctival pallor. Ears, nose, throat:  mucous membranes moist. Midline trachea.  Cardiac: Regular rate and rhythm.  Respiratory:  unlabored. Abdominal:  soft, non-tender, non-distended.  Peripheral vascular: No palpable pedal pulses.  No palpable femoral pulses. Extremity: no edema. no cyanosis. no pallor.  Skin: no gangrene. no ulceration.  Lymphatic: no Stemmer's sign. no palpable lymphadenopathy.  PERTINENT LABORATORY AND RADIOLOGIC DATA  Most recent CBC CBC Latest Ref Rng & Units 03/02/2010 01/06/2010 01/02/2010  WBC 4.0 - 10.5 K/uL 6.1 5.8 5.6  Hemoglobin 13.0 - 17.0 g/dL 01/04/2010 31.5 40.0  Hematocrit 39.0 - 52.0 % 43.0 43.2 39.4  Platelets 150 - 400 K/uL 172 154 171     Most recent CMP CMP Latest Ref Rng & Units 03/02/2010 01/06/2010 01/02/2010  Glucose 70 - 99 mg/dL 01/04/2010) 619(J) 093(O)  BUN 6 - 23 mg/dL 7 9 6   Creatinine 0.4 - 1.5 mg/dL 671(I 4.58  Sodium 135 - 145 mEq/L 134(L) 139 139  Potassium 3.5 - 5.1 mEq/L 4.3 4.0 3.9  Chloride 96 - 112 mEq/L 102 103 106  CO2 19 - 32 mEq/L 25 30 26   Calcium 8.4 - 10.5 mg/dL 9.2 9.4 8.8  Total Protein 6.0 - 8.3 g/dL 7.2 - -  Total Bilirubin 0.3 - 1.2 mg/dL 0.5 - -  Alkaline Phos 39 - 117 U/L 78 - -  AST 0 - 37 U/L 23 - -  ALT 0 - 53 U/L 29 - -    Renal function CrCl cannot be calculated  (Patient's most recent lab result is older than the maximum 21 days  allowed.).  Hgb A1c MFr Bld (%)  Date Value  03/02/2010    5.6 (NOTE)                                                                       According to the ADA Clinical Practice Recommendations for 2011, when HbA1c is used as a screening test:   >=6.5%   Diagnostic of Diabetes Mellitus           (if abnormal result  is confirmed)  5.7-6.4%   Increased risk of developing Diabetes Mellitus  References:Diagnosis and Classification of Diabetes Mellitus,Diabetes [id number] 1):S62-S69 and Standards of Medical Care in         Diabetes - 2011,Diabetes [id number]  (Suppl 1):S11-S61.    LDL Cholesterol  Date Value Ref Range Status  03/03/2010 (H) 0 - 99 mg/dL Final   147        Total Cholesterol/HDL:CHD Risk Coronary Heart Disease Risk Table                     Men   Women  1/2 Average Risk   3.4   3.3  Average Risk       5.0   4.4  2 X Average Risk   9.6   7.1  3 X Average Risk  23.4   11.0        Use the calculated Patient Ratio above and the CHD Risk Table to determine the patient's CHD Risk.        ATP III CLASSIFICATION (LDL):  <100     mg/dL   Optimal  100-129  mg/dL   Near or Above                    Optimal  130-159  mg/dL   Borderline  160-189  mg/dL   High  >190     mg/dL   Very High       ABI Findings:  +---------+------------------+-----+-------------------+-------------------  ----+  Right    Rt Pressure (mmHg)IndexWaveform           Comment                    +---------+------------------+-----+-------------------+-------------------  ----+  Brachial 169                    triphasic                                    +---------+------------------+-----+-------------------+-------------------  ----+  PTA                             unable to insonate                           +---------+------------------+-----+-------------------+-------------------  ----+  DP        41                0.24 dampened monophasic                          +---------+------------------+-----+-------------------+-------------------  ----+  Great Toe                                          No discernable  waveform                                                     present                    +---------+------------------+-----+-------------------+-------------------  ----+   +---------+------------------+-----+-------------------+-------+  Left     Lt Pressure (mmHg)IndexWaveform           Comment  +---------+------------------+-----+-------------------+-------+  Brachial 162                    triphasic                   +---------+------------------+-----+-------------------+-------+  PTA      45                0.27 dampened monophasic         +---------+------------------+-----+-------------------+-------+  DP       66                0.39 dampened monophasic         +---------+------------------+-----+-------------------+-------+  Great Toe15                0.09 Abnormal                    +---------+------------------+-----+-------------------+-------+   Bilateral lower extremity duplex   Summary:  Right: Total occlusion noted in the superficial femoral artery. Total  occlusion noted in the superficial femoral artery and/or popliteal artery.  Total occlusion noted in the popliteal artery.   Bilateral monophasic common femoral artery waveforms suggestive of  aorto-iliac disease.      See table(s) above for measurements and observations.   [name], MD [facility] Phone Number: [phone] 09/16/2021 3:02 PM  Total time spent on preparing this encounter including chart review, data review, collecting history, examining the patient, coordinating care for this new patient, 60 minutes.  Portions of this report may have been transcribed using voice recognition  software.  Every effort has been made to ensure accuracy; however, inadvertent computerized transcription errors may still be present.

## 2021-09-15 NOTE — H&P (View-Only) (Signed)
[facility]   ASSESSMENT / PLAN: Devin Guzman is a 60 y.o. male with atherosclerosis of native arteries of bilateral lower extremites (R>L) causing ischemic rest pain.  Patient counseled patients with chronic limb threatening ischemia have an annual risk of cardiovascular mortality of 25% and a high risk of amputation.   Recommend the following which can slow the progression of atherosclerosis and reduce the risk of major adverse cardiac / limb events:  Complete cessation from all tobacco products. Blood glucose control with goal A1c < 7%. Blood pressure control with goal blood pressure < 140/90 mmHg. Lipid reduction therapy with goal LDL-C <100 mg/dL (<70 if symptomatic from PAD).  Aspirin 81mg  PO QD.  Atorvastatin 40-80mg  PO QD (or other "high intensity" statin therapy).  Plan CT angiogram of the abdomen pelvis with runoff to the toes to evaluate likely aortoiliac occlusive disease.  I will call the patient with the results and plan a intervention based on the findings.  CHIEF COMPLAINT: Right foot pain  HISTORY OF PRESENT ILLNESS: Devin Guzman is a 60 y.o. male who presents to clinic for evaluation of right foot pain and swelling.  The patient reports a fairly classic history of ischemic rest pain.  He does not walk fast or far enough to claudicate, which I suspect is a result of slowly deteriorating arterial disease.  He reports over the past several weeks he has developed constant pain in his right foot which is alleviated by dependency.  He has to sleep with his foot hanging off the bed for relief.  The patient reports no ulcers on either foot.  He is a long-term smoker.  He gets his care through the [place].  He reports no medical problems, but I suspect he has not followed up with his primary physician for health maintenance.  Past medical history: Patient denies Surgical history: Patient denies  Family History  Problem Relation Age of Onset    Hypertension Mother     Social History   Socioeconomic History   Marital status: Single    Spouse name: Not on file   Number of children: Not on file   Years of education: Not on file   Highest education level: Not on file  Occupational History   Not on file  Tobacco Use   Smoking status: Every Day    Packs/day: 0.50    Types: Cigarettes   Smokeless tobacco: Never  Vaping Use   Vaping Use: Never used  Substance and Sexual Activity   Alcohol use: Yes    Alcohol/week: 3.0 standard drinks    Types: 3 Cans of beer per week   Drug use: No   Sexual activity: Not on file  Other Topics Concern   Not on file  Social History Narrative   Not on file   Social Determinants of Health   Financial Resource Strain: Not on file  Food Insecurity: Not on file  Transportation Needs: Not on file  Physical Activity: Not on file  Stress: Not on file  Social Connections: Not on file  Intimate Partner Violence: Not on file    Allergies  Allergen Reactions   Codeine     No current outpatient medications on file.   No current facility-administered medications for this visit.    REVIEW OF SYSTEMS:  [X]  denotes positive finding, [ ]  denotes negative finding Cardiac  Comments:  Chest pain or chest pressure:    Shortness of breath upon exertion:    Short of  breath when lying flat:    Irregular heart rhythm:        Vascular    Pain in calf, thigh, or hip brought on by ambulation:    Pain in feet at night that wakes you up from your sleep:  x   Blood clot in your veins:    Leg swelling:         Pulmonary    Oxygen at home:    Productive cough:     Wheezing:         Neurologic    Sudden weakness in arms or legs:     Sudden numbness in arms or legs:     Sudden onset of difficulty speaking or slurred speech:    Temporary loss of vision in one eye:     Problems with dizziness:         Gastrointestinal    Blood in stool:     Vomited blood:         Genitourinary    Burning  when urinating:     Blood in urine:        Psychiatric    Major depression:         Hematologic    Bleeding problems:    Problems with blood clotting too easily:        Skin    Rashes or ulcers:        Constitutional    Fever or chills:      PHYSICAL EXAM Vitals:   09/16/21 1129  BP: 132/77  Pulse: (!) 55  Resp: 20  Temp: 99.1 F (37.3 C)  SpO2: 100%  Weight: 221 lb (100.2 kg)  Height: 5\' 9"  (1.753 m)    Constitutional: Chronically ill appearing. no distress. Appears well nourished.  Neurologic: CN intact. no focal findings. no sensory loss. Psychiatric:  Mood and affect symmetric and appropriate. Eyes:  No icterus. No conjunctival pallor. Ears, nose, throat:  mucous membranes moist. Midline trachea.  Cardiac: Regular rate and rhythm.  Respiratory:  unlabored. Abdominal:  soft, non-tender, non-distended.  Peripheral vascular: No palpable pedal pulses.  No palpable femoral pulses. Extremity: no edema. no cyanosis. no pallor.  Skin: no gangrene. no ulceration.  Lymphatic: no Stemmer's sign. no palpable lymphadenopathy.  PERTINENT LABORATORY AND RADIOLOGIC DATA  Most recent CBC CBC Latest Ref Rng & Units 03/02/2010 01/06/2010 01/02/2010  WBC 4.0 - 10.5 K/uL 6.1 5.8 5.6  Hemoglobin 13.0 - 17.0 g/dL 14.8 14.6 13.5  Hematocrit 39.0 - 52.0 % 43.0 43.2 39.4  Platelets 150 - 400 K/uL 172 154 171     Most recent CMP CMP Latest Ref Rng & Units 03/02/2010 01/06/2010 01/02/2010  Glucose 70 - 99 mg/dL 109(H) 101(H) 101(H)  BUN 6 - 23 mg/dL 7 9 6   Creatinine 0.4 - 1.5 mg/dL 0.91 1.11 1.02  Sodium 135 - 145 mEq/L 134(L) 139 139  Potassium 3.5 - 5.1 mEq/L 4.3 4.0 3.9  Chloride 96 - 112 mEq/L 102 103 106  CO2 19 - 32 mEq/L 25 30 26   Calcium 8.4 - 10.5 mg/dL 9.2 9.4 8.8  Total Protein 6.0 - 8.3 g/dL 7.2 - -  Total Bilirubin 0.3 - 1.2 mg/dL 0.5 - -  Alkaline Phos 39 - 117 U/L 78 - -  AST 0 - 37 U/L 23 - -  ALT 0 - 53 U/L 29 - -    Renal function CrCl cannot be calculated  (Patient's most recent lab result is older than the maximum 21 days  allowed.).  Hgb A1c MFr Bld (%)  Date Value  03/02/2010    5.6 (NOTE)                                                                       According to the ADA Clinical Practice Recommendations for 2011, when HbA1c is used as a screening test:   >=6.5%   Diagnostic of Diabetes Mellitus           (if abnormal result  is confirmed)  5.7-6.4%   Increased risk of developing Diabetes Mellitus  References:Diagnosis and Classification of Diabetes Mellitus,Diabetes [id number] 1):S62-S69 and Standards of Medical Care in         Diabetes - 2011,Diabetes [id number]  (Suppl 1):S11-S61.    LDL Cholesterol  Date Value Ref Range Status  03/03/2010 (H) 0 - 99 mg/dL Final   147        Total Cholesterol/HDL:CHD Risk Coronary Heart Disease Risk Table                     Men   Women  1/2 Average Risk   3.4   3.3  Average Risk       5.0   4.4  2 X Average Risk   9.6   7.1  3 X Average Risk  23.4   11.0        Use the calculated Patient Ratio above and the CHD Risk Table to determine the patient's CHD Risk.        ATP III CLASSIFICATION (LDL):  <100     mg/dL   Optimal  100-129  mg/dL   Near or Above                    Optimal  130-159  mg/dL   Borderline  160-189  mg/dL   High  >190     mg/dL   Very High       ABI Findings:  +---------+------------------+-----+-------------------+-------------------  ----+   Right     Rt Pressure (mmHg) Index Waveform            Comment                     +---------+------------------+-----+-------------------+-------------------  ----+   Brachial  169                      triphasic                                      +---------+------------------+-----+-------------------+-------------------  ----+   PTA                                unable to insonate                             +---------+------------------+-----+-------------------+-------------------  ----+   DP         41                 0.24  dampened monophasic                            +---------+------------------+-----+-------------------+-------------------  ----+  Great Toe                                              No discernable  waveform                                                           present                     +---------+------------------+-----+-------------------+-------------------  ----+   +---------+------------------+-----+-------------------+-------+   Left      Lt Pressure (mmHg) Index Waveform            Comment   +---------+------------------+-----+-------------------+-------+   Brachial  162                      triphasic                     +---------+------------------+-----+-------------------+-------+   PTA       45                 0.27  dampened monophasic           +---------+------------------+-----+-------------------+-------+   DP        66                 0.39  dampened monophasic           +---------+------------------+-----+-------------------+-------+   Great Toe 15                 0.09  Abnormal                      +---------+------------------+-----+-------------------+-------+   Bilateral lower extremity duplex   Summary:  Right: Total occlusion noted in the superficial femoral artery. Total  occlusion noted in the superficial femoral artery and/or popliteal artery.  Total occlusion noted in the popliteal artery.   Bilateral monophasic common femoral artery waveforms suggestive of  aorto-iliac disease.      See table(s) above for measurements and observations.   [name], MD [facility] Phone Number: [phone] 09/16/2021 3:02 PM  Total time spent on preparing this encounter including chart review, data review, collecting history, examining the patient, coordinating care for this new patient, 60 minutes.  Portions of this report may have been transcribed using voice recognition  software.  Every effort has been made to ensure accuracy; however, inadvertent computerized transcription errors may still be present.

## 2021-09-16 ENCOUNTER — Encounter: Payer: Self-pay | Admitting: Vascular Surgery

## 2021-09-16 ENCOUNTER — Other Ambulatory Visit: Payer: Self-pay

## 2021-09-16 ENCOUNTER — Ambulatory Visit: Payer: 59 | Admitting: Vascular Surgery

## 2021-09-16 VITALS — BP 132/77 | HR 55 | Temp 99.1°F | Resp 20 | Ht 69.0 in | Wt 221.0 lb

## 2021-09-16 DIAGNOSIS — I739 Peripheral vascular disease, unspecified: Secondary | ICD-10-CM

## 2021-09-16 DIAGNOSIS — I7409 Other arterial embolism and thrombosis of abdominal aorta: Secondary | ICD-10-CM | POA: Diagnosis not present

## 2021-09-23 ENCOUNTER — Other Ambulatory Visit: Payer: Self-pay

## 2021-09-23 ENCOUNTER — Ambulatory Visit: Payer: 59 | Admitting: Vascular Surgery

## 2021-09-24 ENCOUNTER — Other Ambulatory Visit: Payer: Self-pay

## 2021-09-24 DIAGNOSIS — I739 Peripheral vascular disease, unspecified: Secondary | ICD-10-CM

## 2021-09-25 ENCOUNTER — Ambulatory Visit
Admission: RE | Admit: 2021-09-25 | Discharge: 2021-09-25 | Disposition: A | Discharge status: Home / Self Care | Payer: 59 | Source: Ambulatory Visit | Attending: Vascular Surgery | Admitting: Vascular Surgery

## 2021-09-25 DIAGNOSIS — I739 Peripheral vascular disease, unspecified: Secondary | ICD-10-CM

## 2021-09-25 IMAGING — CT CT ANGIO AOBIFEM WO/W CM
1 series · 4 of 16 positions shown, 5 images · IV contrast (iopamidol)
Comparison: None.

CLINICAL DATA: Peripheral arterial disease

Right foot swelling
EXAM:
CT ANGIOGRAPHY OF ABDOMINAL AORTA WITH ILIOFEMORAL RUNOFF
TECHNIQUE: Multidetector CT imaging of the abdomen, pelvis and lower
extremities was performed using the standard protocol during bolus
administration of intravenous contrast. Multiplanar CT image
reconstructions and MIPs were obtained to evaluate the vascular
anatomy.
CONTRAST:  100mL EBM1W4-NOT IOPAMIDOL (EBM1W4-NOT) INJECTION 76%

[Series 14: cta whole body 2.00 bv36 s3 sag legs mip · sagittal · 0.80mm/px · 4 of 196 slices shown, 5 images]
[im 27/196  soft-tissue]
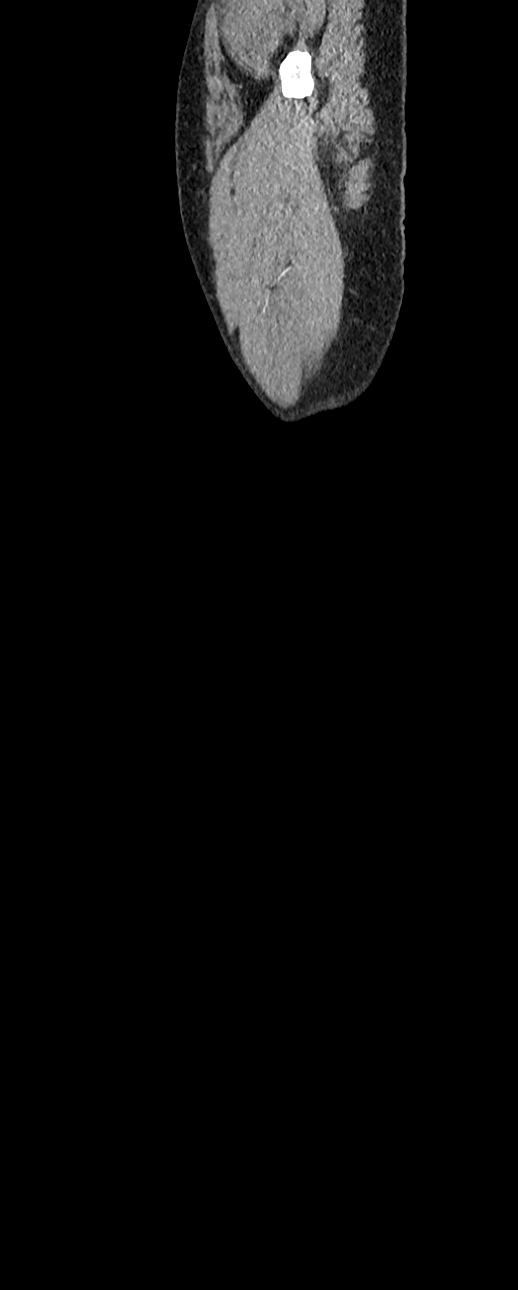
[im 27/196  bone]
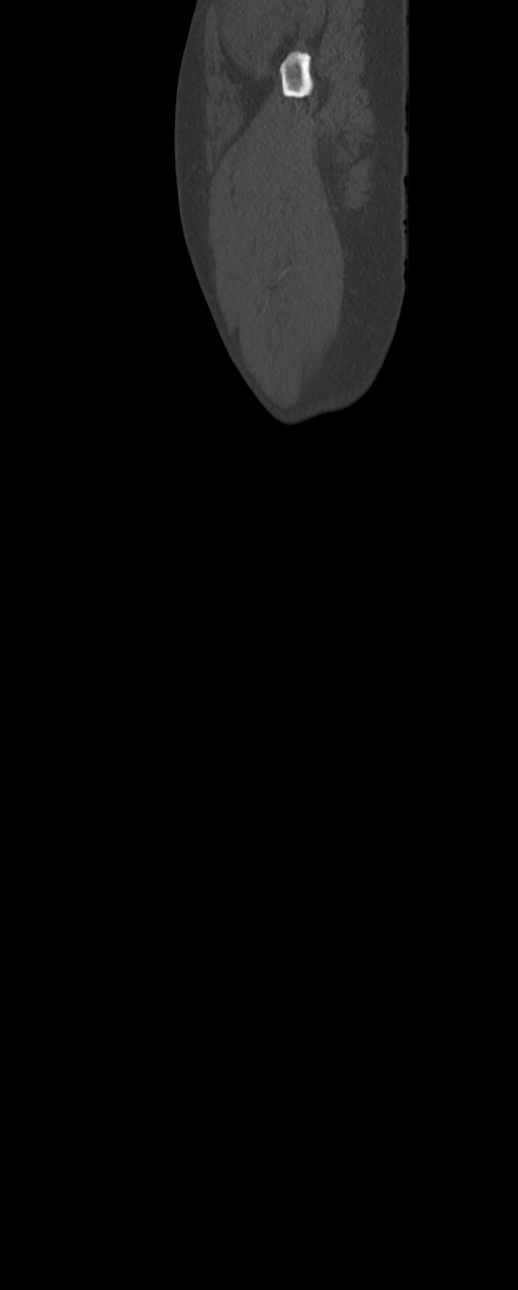
[im 79/196  soft-tissue]
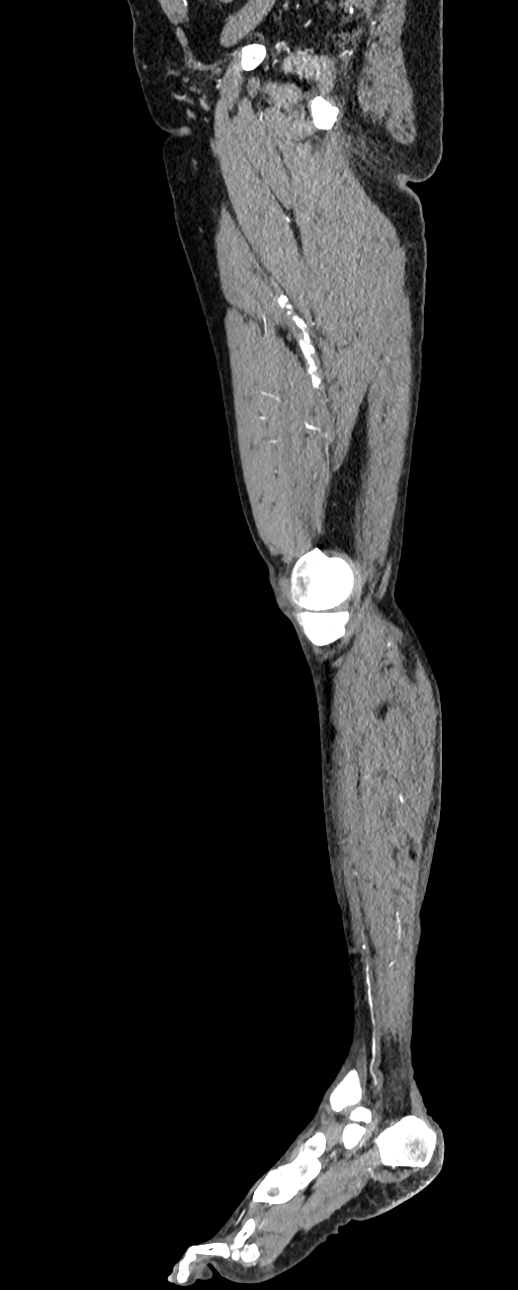
[im 118/196  soft-tissue]
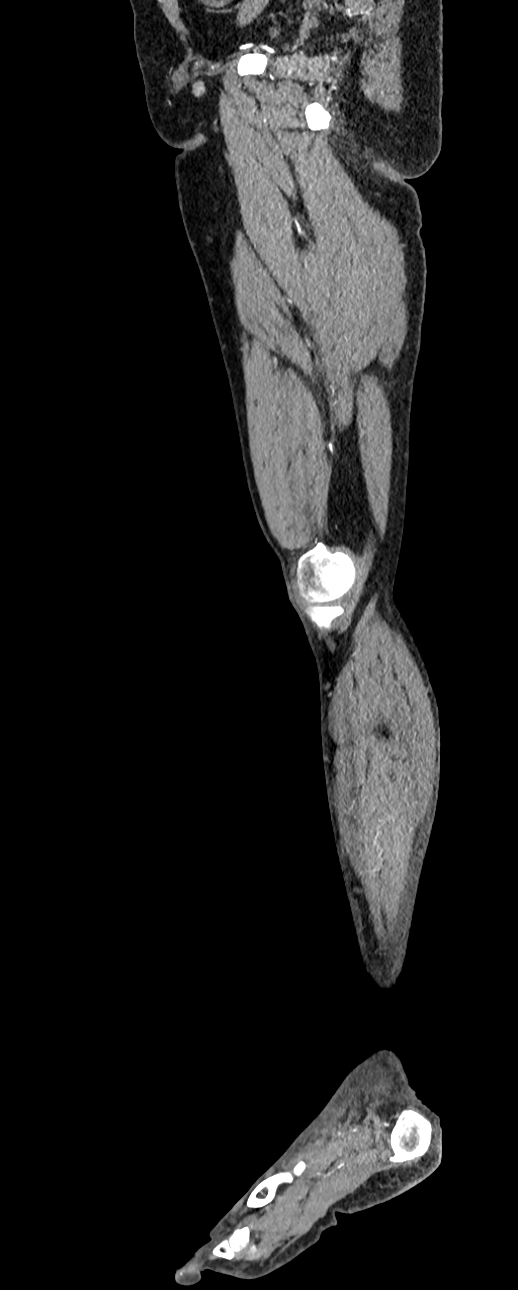
[im 170/196  soft-tissue]
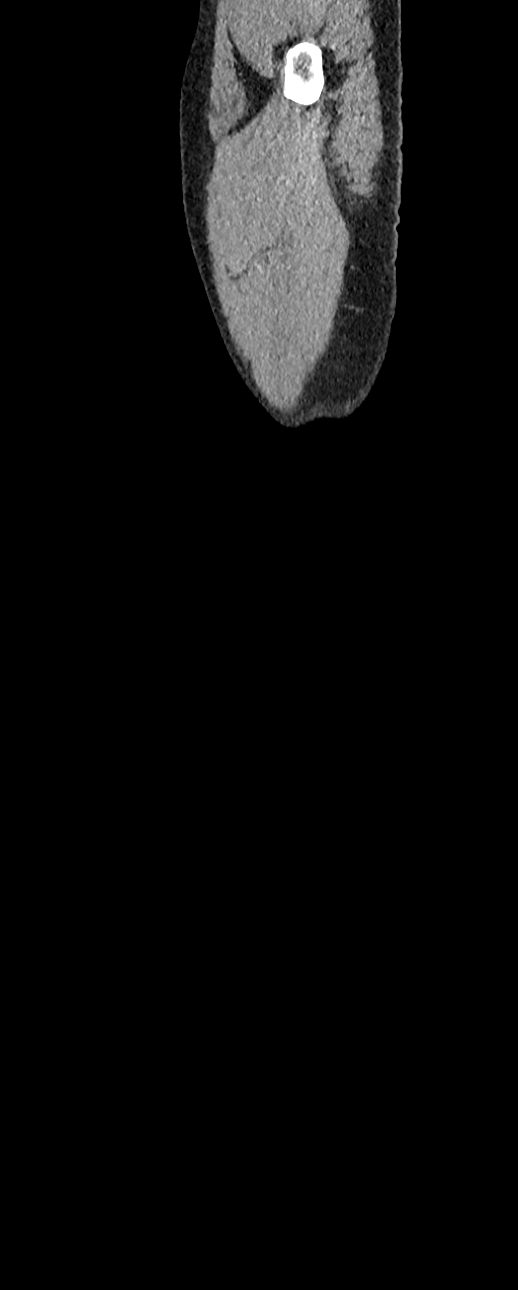

[4 of 16 positions shown; findings below may reference images not displayed]

FINDINGS: VASCULAR

Aorta: Calcified and noncalcified atheromatous plaque seen in the
infrarenal abdominal aorta without aneurysm or flow-limiting
stenosis.

Celiac: Patent without evidence of aneurysm, dissection, vasculitis
or significant stenosis.

SMA: Patent without evidence of aneurysm, dissection, vasculitis or
significant stenosis.

Renals: Both renal arteries are patent without evidence of aneurysm,
dissection, vasculitis, fibromuscular dysplasia or significant
stenosis.

IMA: Moderate stenosis at the origin, otherwise patent.

RIGHT Lower Extremity

Inflow: Aneurysmal dilatation of the common iliac artery measuring
up to 2.4 cm. No significant stenosis of the common iliac artery.
Complete to near complete occlusion of the origin of the internal
iliac artery. Opacification of distal branches indicative of
retrograde collateral flow. Focal stenosis of the proximal right
external iliac artery due to predominantly noncalcified plaque
measuring approximately 65%.

Outflow: No significant stenosis of the common femoral or Profunda
femoris arteries. The superficial femoral and popliteal arteries are
occluded.

Runoff: Tibioperoneal trunk is occluded. There is reconstitution of
flow in the anterior tibial and peroneal arteries in the proximal
lower leg. The anterior tibial artery is diffusely diseased.

LEFT Lower Extremity

Inflow: Left common iliac artery is diffusely disease with
predominant noncalcified plaque without flow-limiting stenosis.
There is occlusion of the mid left internal iliac artery with mild
dilatation measuring up to 1.2 cm. Opacification of distal internal
iliac artery branches likely due to retrograde collateral flow.
Focal stenosis of the proximal left external iliac artery measuring
approximately 60%.

Outflow: No significant stenosis of the common femoral or Profunda
femoris arteries. Left SFA is occluded 3 cm from its origin. Flow
reconstitutes within a heavily disease diffusely narrowed distal
left SFA. Distal popliteal artery is occluded.

Runoff: Aberrant origin of the anterior tibial artery from the
popliteal artery above the level of the tibial plateau.
Tibioperoneal trunk is occluded. Flow reconstitutes within the
peroneal artery in the mid lower leg. There is single vessel inline
flow to the ankle through the anterior tibial artery.

Veins: No obvious venous abnormality within the limitations of this
arterial phase study.

Review of the MIP images confirms the above findings.

NON-VASCULAR

Lower chest: No acute abnormality.

Hepatobiliary: Visualized portions the liver are unremarkable. No
significant abnormality of bowel ducts or gallbladder.

Pancreas: Unremarkable. No pancreatic ductal dilatation or
surrounding inflammatory changes.

Spleen: Visualized portions are unremarkable.

Adrenals/Urinary Tract: Adrenal glands are unremarkable. Kidneys are
normal, without renal calculi, focal lesion, or hydronephrosis.
Bladder is unremarkable.

Stomach/Bowel: No bowel dilatation to indicate ileus or obstruction.
Appendix is normal. Mild sigmoid diverticulosis.

Lymphatic: No enlarged abdominal or pelvic lymph nodes

Reproductive: Prostate is unremarkable.

Other: Minimal subcutaneous edema of the calves and right ankle.

Musculoskeletal: No acute or significant osseous findings.
IMPRESSION: VASCULAR

1. Aneurysm of the right common iliac artery measuring 2.4 cm.
2. Complete to near complete occlusion at the origin of the right
internal iliac artery.
3. 65% focal stenosis of the proximal right external iliac artery.
4. Occlusion of the right superficial femoral and popliteal
arteries.
5. Occluded right tibioperoneal trunk. Flow reconstitutes in the
anterior tibial and peroneal arteries in the proximal right lower
leg. Two vessel runoff to the right ankle.
6. Occluded proximal left internal iliac artery with fusiform
aneurysm measuring 1.2 cm.
7. Focal stenosis of the left proximal external iliac artery
measuring approximately 60%.
8. Long segment occlusion of the left superficial femoral artery.
9. Occlusion of the distal left popliteal artery and tibioperoneal
trunk.
10. Aberrant origin of the left anterior tibial artery originating
above the level of the tibial plateau from the popliteal artery.
Single vessel inline flow to the left ankle through the anterior
tibial artery.

NON-VASCULAR

No acute abnormality of abdomen or pelvis.

These results will be called to the ordering clinician or
representative by the Radiologist Assistant, and communication
documented in the PACS or [REDACTED].

## 2021-09-25 MED ORDER — IOPAMIDOL (ISOVUE-370) INJECTION 76%
100.0000 mL | Freq: Once | INTRAVENOUS | Status: AC | PRN
  Administered 2021-09-25: 100 mL via INTRAVENOUS

## 2021-10-02 ENCOUNTER — Other Ambulatory Visit: Payer: Self-pay

## 2021-10-07 ENCOUNTER — Encounter: Payer: Self-pay | Admitting: Vascular Surgery

## 2021-10-07 ENCOUNTER — Other Ambulatory Visit: Payer: Self-pay

## 2021-10-07 ENCOUNTER — Ambulatory Visit: Payer: 59 | Admitting: Vascular Surgery

## 2021-10-07 DIAGNOSIS — I70223 Atherosclerosis of native arteries of extremities with rest pain, bilateral legs: Secondary | ICD-10-CM

## 2021-10-07 NOTE — Progress Notes (Signed)
Discussed CT angiogram findings with patient on phone in detail. I explained that often inflow procedures alone are enough to correct rest pain. We will plan for bilateral iliac artery stenting via left common femoral artery approach in Cath Lab on Friday 10/10/21.  Rande Brunt. Lenell Antu, MD [facility] Phone Number: [phone] 10/07/2021 1:39 PM

## 2021-10-10 ENCOUNTER — Other Ambulatory Visit (HOSPITAL_COMMUNITY): Payer: Self-pay

## 2021-10-10 ENCOUNTER — Ambulatory Visit (HOSPITAL_COMMUNITY)
Admission: RE | Admit: 2021-10-10 | Discharge: 2021-10-10 | Disposition: A | Discharge status: Home / Self Care | Payer: 59 | Attending: Vascular Surgery | Admitting: Vascular Surgery

## 2021-10-10 ENCOUNTER — Ambulatory Visit (HOSPITAL_BASED_OUTPATIENT_CLINIC_OR_DEPARTMENT_OTHER): Payer: 59

## 2021-10-10 ENCOUNTER — Other Ambulatory Visit: Payer: Self-pay

## 2021-10-10 ENCOUNTER — Encounter (HOSPITAL_COMMUNITY)
Admission: RE | Disposition: A | Discharge status: Home / Self Care | Payer: Self-pay | Source: Home / Self Care | Attending: Vascular Surgery

## 2021-10-10 DIAGNOSIS — Z79899 Other long term (current) drug therapy: Secondary | ICD-10-CM | POA: Diagnosis not present

## 2021-10-10 DIAGNOSIS — F17210 Nicotine dependence, cigarettes, uncomplicated: Secondary | ICD-10-CM | POA: Insufficient documentation

## 2021-10-10 DIAGNOSIS — I70223 Atherosclerosis of native arteries of extremities with rest pain, bilateral legs: Secondary | ICD-10-CM | POA: Insufficient documentation

## 2021-10-10 DIAGNOSIS — M79671 Pain in right foot: Secondary | ICD-10-CM | POA: Insufficient documentation

## 2021-10-10 DIAGNOSIS — Z01810 Encounter for preprocedural cardiovascular examination: Secondary | ICD-10-CM | POA: Diagnosis not present

## 2021-10-10 DIAGNOSIS — I745 Embolism and thrombosis of iliac artery: Secondary | ICD-10-CM

## 2021-10-10 DIAGNOSIS — Z7982 Long term (current) use of aspirin: Secondary | ICD-10-CM | POA: Diagnosis not present

## 2021-10-10 HISTORY — PX: ABDOMINAL AORTOGRAM W/LOWER EXTREMITY: CATH118223

## 2021-10-10 HISTORY — PX: PERIPHERAL VASCULAR INTERVENTION: CATH118257

## 2021-10-10 LAB — POCT I-STAT, CHEM 8
BUN: 11 mg/dL (ref 6–20)
Calcium, Ion: 1.17 mmol/L (ref 1.15–1.40)
Chloride: 104 mmol/L (ref 98–111)
Creatinine, Ser: 1 mg/dL (ref 0.61–1.24)
Glucose, Bld: 103 mg/dL — ABNORMAL HIGH (ref 70–99)
HCT: 46 % (ref 39.0–52.0)
Hemoglobin: 15.6 g/dL (ref 13.0–17.0)
Potassium: 4.3 mmol/L (ref 3.5–5.1)
Sodium: 137 mmol/L (ref 135–145)
TCO2: 27 mmol/L (ref 22–32)

## 2021-10-10 LAB — POCT ACTIVATED CLOTTING TIME
Activated Clotting Time: 143 seconds
Activated Clotting Time: 269 seconds

## 2021-10-10 SURGERY — ABDOMINAL AORTOGRAM W/LOWER EXTREMITY
Anesthesia: LOCAL

## 2021-10-10 MED ORDER — HEPARIN SODIUM (PORCINE) 1000 UNIT/ML IJ SOLN
INTRAMUSCULAR | Status: AC
  Filled 2021-10-10: qty 10

## 2021-10-10 MED ORDER — SODIUM CHLORIDE 0.9 % IV SOLN: 250.0000 mL | INTRAVENOUS | Status: DC | PRN

## 2021-10-10 MED ORDER — MIDAZOLAM HCL 2 MG/2ML IJ SOLN
INTRAMUSCULAR | Status: AC
  Filled 2021-10-10: qty 2

## 2021-10-10 MED ORDER — HEPARIN (PORCINE) IN NACL 1000-0.9 UT/500ML-% IV SOLN
INTRAVENOUS | Status: DC | PRN
  Administered 2021-10-10 (×2): 500 mL

## 2021-10-10 MED ORDER — HEPARIN SODIUM (PORCINE) 1000 UNIT/ML IJ SOLN
INTRAMUSCULAR | Status: DC | PRN
  Administered 2021-10-10: 10000 [IU] via INTRAVENOUS

## 2021-10-10 MED ORDER — HEPARIN (PORCINE) IN NACL 1000-0.9 UT/500ML-% IV SOLN
INTRAVENOUS | Status: AC
  Filled 2021-10-10: qty 1000

## 2021-10-10 MED ORDER — SODIUM CHLORIDE 0.9 % WEIGHT BASED INFUSION: 1.0000 mL/kg/h | INTRAVENOUS | Status: DC

## 2021-10-10 MED ORDER — ATORVASTATIN CALCIUM 40 MG PO TABS
40.0000 mg | ORAL_TABLET | Freq: Every day | ORAL | 11 refills | Status: DC
  Filled 2021-10-10: qty 30, 30d supply, fill #0

## 2021-10-10 MED ORDER — ATORVASTATIN CALCIUM 40 MG PO TABS
40.0000 mg | ORAL_TABLET | Freq: Every day | ORAL | Status: DC
  Administered 2021-10-10: 15:00:00 40 mg via ORAL
  Filled 2021-10-10: qty 1

## 2021-10-10 MED ORDER — HYDRALAZINE HCL 20 MG/ML IJ SOLN: 5.0000 mg | INTRAMUSCULAR | Status: DC | PRN

## 2021-10-10 MED ORDER — FENTANYL CITRATE (PF) 100 MCG/2ML IJ SOLN
INTRAMUSCULAR | Status: AC
  Filled 2021-10-10: qty 2

## 2021-10-10 MED ORDER — PROTAMINE SULFATE 10 MG/ML IV SOLN
INTRAVENOUS | Status: DC | PRN
  Administered 2021-10-10: 45 mg via INTRAVENOUS
  Administered 2021-10-10: 5 mg via INTRAVENOUS

## 2021-10-10 MED ORDER — SODIUM CHLORIDE 0.9% FLUSH: 3.0000 mL | Freq: Two times a day (BID) | INTRAVENOUS | Status: DC

## 2021-10-10 MED ORDER — CLOPIDOGREL BISULFATE 75 MG PO TABS
300.0000 mg | ORAL_TABLET | Freq: Once | ORAL | Status: AC
  Administered 2021-10-10: 15:00:00 300 mg via ORAL
  Filled 2021-10-10: qty 4

## 2021-10-10 MED ORDER — SODIUM CHLORIDE 0.9% FLUSH: 3.0000 mL | INTRAVENOUS | Status: DC | PRN

## 2021-10-10 MED ORDER — MIDAZOLAM HCL 2 MG/2ML IJ SOLN
INTRAMUSCULAR | Status: DC | PRN
  Administered 2021-10-10: 1 mg via INTRAVENOUS

## 2021-10-10 MED ORDER — CLOPIDOGREL BISULFATE 75 MG PO TABS
75.0000 mg | ORAL_TABLET | Freq: Every day | ORAL | 11 refills | Status: AC
  Filled 2021-10-10: qty 30, 30d supply, fill #0

## 2021-10-10 MED ORDER — LABETALOL HCL 5 MG/ML IV SOLN: 10.0000 mg | INTRAVENOUS | Status: DC | PRN

## 2021-10-10 MED ORDER — ONDANSETRON HCL 4 MG/2ML IJ SOLN: 4.0000 mg | Freq: Four times a day (QID) | INTRAMUSCULAR | Status: DC | PRN

## 2021-10-10 MED ORDER — ACETAMINOPHEN 325 MG PO TABS: 650.0000 mg | ORAL_TABLET | ORAL | Status: DC | PRN

## 2021-10-10 MED ORDER — SODIUM CHLORIDE 0.9 % IV SOLN: INTRAVENOUS | Status: DC

## 2021-10-10 MED ORDER — PROTAMINE SULFATE 10 MG/ML IV SOLN
INTRAVENOUS | Status: AC
  Filled 2021-10-10: qty 5

## 2021-10-10 MED ORDER — IODIXANOL 320 MG/ML IV SOLN
INTRAVENOUS | Status: DC | PRN
  Administered 2021-10-10: 11:00:00 155 mL via INTRA_ARTERIAL

## 2021-10-10 MED ORDER — CLOPIDOGREL BISULFATE 75 MG PO TABS
75.0000 mg | ORAL_TABLET | Freq: Every day | ORAL | Status: DC
  Filled 2021-10-10: qty 1

## 2021-10-10 MED ORDER — LIDOCAINE HCL (PF) 1 % IJ SOLN
INTRAMUSCULAR | Status: AC
  Filled 2021-10-10: qty 30

## 2021-10-10 MED ORDER — FENTANYL CITRATE (PF) 100 MCG/2ML IJ SOLN
INTRAMUSCULAR | Status: DC | PRN
  Administered 2021-10-10 (×2): 50 ug via INTRAVENOUS

## 2021-10-10 MED ORDER — LIDOCAINE HCL (PF) 1 % IJ SOLN
INTRAMUSCULAR | Status: DC | PRN
  Administered 2021-10-10: 30 mL

## 2021-10-10 SURGICAL SUPPLY — 17 items
BALLN MUSTANG 7.0X20 75 (BALLOONS) ×3
BALLOON MUSTANG 7.0X20 75 (BALLOONS) IMPLANT
CATH OMNI FLUSH 5F 65CM (CATHETERS) ×1 IMPLANT
GLIDEWIRE ADV .035X260CM (WIRE) ×1 IMPLANT
KIT ENCORE 26 ADVANTAGE (KITS) ×1 IMPLANT
KIT MICROPUNCTURE NIT STIFF (SHEATH) ×1 IMPLANT
KIT PV (KITS) ×3 IMPLANT
SHEATH PINNACLE 5F 10CM (SHEATH) ×1 IMPLANT
SHEATH PINNACLE 6F 10CM (SHEATH) ×1 IMPLANT
SHEATH PINNACLE ST 6F 45CM (SHEATH) ×1 IMPLANT
SHEATH PROBE COVER 6X72 (BAG) ×1 IMPLANT
STENT INNOVA 8X20X130 (Permanent Stent) ×1 IMPLANT
STENT INNOVA 8X40X130 (Permanent Stent) ×1 IMPLANT
SYR MEDRAD MARK V 150ML (SYRINGE) ×1 IMPLANT
TRANSDUCER W/STOPCOCK (MISCELLANEOUS) ×3 IMPLANT
TRAY PV CATH (CUSTOM PROCEDURE TRAY) ×3 IMPLANT
WIRE BENTSON .035X145CM (WIRE) ×1 IMPLANT

## 2021-10-10 NOTE — Op Note (Signed)
DATE OF SERVICE: 10/10/2021  PATIENT:  Devin Guzman  60 y.o. male  PRE-OPERATIVE DIAGNOSIS:  Atherosclerosis of native arteries of bilateral lower extremities causing rest pain  POST-OPERATIVE DIAGNOSIS:  Same  PROCEDURE:   1) Ultrasound guided left common femoral artery access 2) Aortogram 3) Right lower extremity angiogram with second order cannulation ( total contrast) 4) Right external iliac artery angioplasty and stenting (8x47mm Innova) 5) Left external iliac artery angioplasty and stenting (8x61mm Innova) 6) Conscious sedation (54 minutes)  SURGEON:  Rande Brunt. Lenell Antu, MD  ASSISTANT: none  ANESTHESIA:   local and IV sedation  ESTIMATED BLOOD LOSS: minimal  LOCAL MEDICATIONS USED:  LIDOCAINE   COUNTS: confirmed correct.  PATIENT DISPOSITION:  PACU - hemodynamically stable.   Delay start of Pharmacological VTE agent (>24hrs) due to surgical blood loss or risk of bleeding: no  INDICATION FOR PROCEDURE: Devin Guzman is a 60 y.o. male with severe peripheral arterial disease and ischemic rest pain.  He has no palpable femoral pulses which prompted CT angiogram.  This revealed severe multilevel occlusive disease.  We did not visualize the tibial vessels very well.  After careful discussion of risks, benefits, and alternatives the patient was offered angiogram. The patient understood and wished to proceed.  OPERATIVE FINDINGS:  Terminal aorta and iliac arteries: Patent without flow-limiting stenosis. Previously demonstrated iliac artery aneurysms were not well visualized on angiogram today.   The right hypogastric artery is occluded.   There is a 80% stenosis of the proximal left external iliac artery. There is 90% stenosis of the right mid external iliac artery.  Right lower extremity: Common femoral artery: Patent without flow-limiting stenosis Profunda femoris artery: No flow-limiting stenosis Superficial femoral artery: Occluded Popliteal artery: Small island of  above-knee popliteal artery reconstitutes via collaterals and then re-occludes behind and below the knee Anterior tibial artery: Occluded Tibioperoneal trunk: Occluded Peroneal artery: Reconstitutes in the proximal calf and courses to the foot, filling the pedal circulation Posterior tibial artery: Occluded Pedal circulation: Diminutive pedal arch visualized filling via arborization from the peroneal artery  Left lower extremity: Common femoral artery: Patent without flow-limiting stenosis Profunda femoris artery: No flow-limiting stenosis Superficial femoral artery: Occluded Popliteal artery: Reconstitutes above the knee, and is patent behind and below the knee to the takeoff of the anterior tibial artery Anterior tibial artery: Dominant tibial vessel, courses to the foot Tibioperoneal trunk: Occluded Peroneal artery: Occluded  Posterior tibial artery: Occluded Pedal circulation: Not well-visualized  GLASS score. Grade 3: expect 53% 1 year re-intervention rate; expect 69% 5 year re-intervention rate; expect 49% 5 year mortality; expect 61% rate of restenosis from open operation at 5 years; expect 83% rate of restenosis from endovascular procedure.  DESCRIPTION OF PROCEDURE: After identification of the patient in the pre-operative holding area, the patient was transferred to the operating room. The patient was positioned supine on the operating room table. Anesthesia was induced. The groins was prepped and draped in standard fashion. A surgical pause was performed confirming correct patient, procedure, and operative location.  The left groin was anesthetized with subcutaneous injection of 1% lidocaine. Using ultrasound guidance, the left common femoral artery was accessed with micropuncture technique. Fluoroscopy was used to confirm cannulation over the femoral head. The 58F sheath was upsized to 21F.   A Benson wire was advanced into the distal aorta. Over the wire an omni flush catheter was  advanced to the level of L2. Aortogram was performed - see above for details.  The right common  iliac artery was selected with a Bentson guidewire. The wire was advanced into the common femoral artery. Over the wire the omni flush catheter was advanced into the external iliac artery.  The wire was exchanged for a Glidewire advantage.  The catheter was removed; the 5 French sheath was exchanged for a 6 [place] by 45 cm sheath.  The decision was made to intervene. The patient was heparinized with 10,000 units of heparin. Selective angiography of the left lower extremity was performed prior to intervention.   The lesions were treated with: Right external iliac angioplasty and stenting; 8 x 40 mm Innova stent postdilated with a 7 x 20 mm Mustang balloon. Left external iliac angioplasty and stenting; 8 x 20 mm Innova stent postdilated with a 7 x 20 mm Mustang balloon.  Completion angiography revealed:  Resolution of external iliac stenosis  The 6 French by 45 cm sheath was then exchanged for a short 6 [place] sheath.  The Glidewire was directed into the terminal aorta.  Over the wire and Omni Flush catheter was advanced.  Bilateral lower extremity runoff angiography was performed - see above for details.  Given his need for bypass in the future, the sheath was left in place to be removed in the recovery area.   Conscious sedation was administered with the use of IV fentanyl and midazolam under continuous physician and nurse monitoring.  Heart rate, blood pressure, and oxygen saturation were continuously monitored.  Total sedation time was 54 minutes  Upon completion of the case instrument and sharps counts were confirmed correct. The patient was transferred to the PACU in good condition. I was present for all portions of the procedure.  PLAN: Aspirin 81 mg by mouth indefinitely.  Plavix 75 mg by mouth x12 months.  High intensity statin therapy indefinitely.  We will reevaluate patient in clinic on  Tuesday.  Recheck ABI.  We will check greater saphenous vein mapping.  He will likely need right femoral peroneal bypass for ischemic rest pain if rest pain is not adequately treated by iliac stenting.  Rande Brunt. Lenell Antu, MD [facility] Phone Number: [phone] 10/10/2021 10:51 AM

## 2021-10-10 NOTE — Progress Notes (Signed)
SITE AREA: left groin/femoral  SITE PRIOR TO REMOVAL:  LEVEL 0  PRESSURE APPLIED FOR: approximately 20 minutes   MANUAL: yes  PATIENT STATUS DURING PULL: stable  POST PULL SITE:  LEVEL 0  POST PULL INSTRUCTIONS GIVEN: yes  POST PULL PULSES PRESENT: +1 left pedal pulse  DRESSING APPLIED: gauze with tegaderm  BEDREST BEGINS @ 1200  COMMENTS:  sheath removed by Raquel Sarna, RTR

## 2021-10-10 NOTE — Interval H&P Note (Signed)
History and Physical Interval Note:  10/10/2021 7:21 AM  Devin Guzman  has presented today for surgery, with the diagnosis of AORTIC ILIAC OCCLUSIVE DISEASE.  The various methods of treatment have been discussed with the patient and family. After consideration of risks, benefits and other options for treatment, the patient has consented to  Procedure(s): ABDOMINAL AORTOGRAM W/LOWER EXTREMITY (N/A) as a surgical intervention.  The patient's history has been reviewed, patient examined, no change in status, stable for surgery.  I have reviewed the patient's chart and labs.  Questions were answered to the patient's satisfaction.     Leonie Douglas

## 2021-10-10 NOTE — Progress Notes (Signed)
Bilateral lower extremity vein mapping completed. Refer to "CV Proc" under chart review to view preliminary results.  10/10/2021 3:33 PM Eula Fried., MHA, RVT, RDCS, RDMS

## 2021-10-14 ENCOUNTER — Encounter: Payer: 59 | Admitting: Vascular Surgery

## 2021-10-14 ENCOUNTER — Encounter (HOSPITAL_COMMUNITY): Payer: Self-pay | Admitting: Vascular Surgery

## 2021-10-14 ENCOUNTER — Ambulatory Visit (HOSPITAL_COMMUNITY): Payer: 59

## 2021-10-14 ENCOUNTER — Ambulatory Visit (INDEPENDENT_AMBULATORY_CARE_PROVIDER_SITE_OTHER): Payer: 59 | Admitting: Vascular Surgery

## 2021-10-14 ENCOUNTER — Other Ambulatory Visit: Payer: Self-pay | Admitting: *Deleted

## 2021-10-14 ENCOUNTER — Encounter: Payer: Self-pay | Admitting: Vascular Surgery

## 2021-10-14 ENCOUNTER — Other Ambulatory Visit: Payer: Self-pay

## 2021-10-14 ENCOUNTER — Ambulatory Visit (HOSPITAL_COMMUNITY)
Admission: RE | Admit: 2021-10-14 | Discharge: 2021-10-14 | Disposition: A | Discharge status: Home / Self Care | Payer: 59 | Source: Ambulatory Visit | Attending: Vascular Surgery | Admitting: Vascular Surgery

## 2021-10-14 VITALS — BP 119/79 | HR 62 | Temp 98.3°F | Resp 20 | Ht 67.0 in | Wt 219.0 lb

## 2021-10-14 DIAGNOSIS — I739 Peripheral vascular disease, unspecified: Secondary | ICD-10-CM

## 2021-10-14 NOTE — Progress Notes (Signed)
[facility] [place] PROGRESS NOTE  ASSESSMENT / PLAN: Devin Guzman is a 61 y.o. male who returns for evaluation after iliac artery stenting. He reports he still has pain in his feet, but now reports he mostly notices it with walking. He does not have quality saphenous vein. I recommended against a prosthetic bypass for as long as possible, and would only recommend this for [place] 5+ CLTI.  I counseled him about limb threatening syndromes, and he is understanding.  He may return to work.  He can follow-up with me as needed for deteriorating arterial disease  SUBJECTIVE: Does not feel much different.  After some prompting, he does report less pain in his feet, and states he only notices this when walking.  OBJECTIVE: BP 119/79 (BP Location: Left Arm, Patient Position: Sitting, Cuff Size: Large)    Pulse 62    Temp 98.3 F (36.8 C)    Resp 20    Ht 5\' 7"  (1.702 m)    Wt 219 lb (99.3 kg)    SpO2 98%    BMI 34.30 kg/m   No acute distress Regular rate and rhythm Unlabored breathing No palpable pedal pulses   CBC Latest Ref Rng & Units 10/10/2021 03/02/2010 01/06/2010  WBC 4.0 - 10.5 K/uL - 6.1 5.8  Hemoglobin 13.0 - 17.0 g/dL 15.6 14.8 14.6  Hematocrit 39.0 - 52.0 % 46.0 43.0 43.2  Platelets 150 - 400 K/uL - 172 154     CMP Latest Ref Rng & Units 10/10/2021 03/02/2010 01/06/2010  Glucose 70 - 99 mg/dL 103(H) 109(H) 101(H)  BUN 6 - 20 mg/dL 11 7 9   Creatinine 0.61 - 1.24 mg/dL 1.00 0.91 1.11  Sodium 135 - 145 mmol/L 137 134(L) 139  Potassium 3.5 - 5.1 mmol/L 4.3 4.3 4.0  Chloride 98 - 111 mmol/L 104 102 103  CO2 19 - 32 mEq/L - 25 30  Calcium 8.4 - 10.5 mg/dL - 9.2 9.4  Total Protein 6.0 - 8.3 g/dL - 7.2 -  Total Bilirubin 0.3 - 1.2 mg/dL - 0.5 -  Alkaline Phos 39 - 117 U/L - 78 -  AST 0 - 37 U/L - 23 -  ALT 0 - 53 U/L - 29 -    Estimated Creatinine Clearance: 88.2 mL/min (by C-G formula based on SCr of 1  mg/dL).   +-------+-----------+-----------+------------+------------+   ABI/TBI Today's ABI Today's TBI Previous ABI Previous TBI   +-------+-----------+-----------+------------+------------+   Right   0.28        0.00        0.24         0.00           +-------+-----------+-----------+------------+------------+   Left    0.43        0.00        0.39         0.09           +-------+-----------+-----------+------------+------------+   Devin Aline. Stanford Breed, MD [facility] Phone Number: [phone] 10/14/2021 3:02 PM

## 2021-10-14 NOTE — Progress Notes (Signed)
ABI completed. Refer to "CV Proc" under chart review to view preliminary results.  10/14/2021 2:30 PM Eula Fried., MHA, RVT, RDCS, RDMS

## 2021-10-15 ENCOUNTER — Other Ambulatory Visit: Payer: Self-pay

## 2021-10-15 DIAGNOSIS — I739 Peripheral vascular disease, unspecified: Secondary | ICD-10-CM

## 2021-10-22 ENCOUNTER — Telehealth (HOSPITAL_COMMUNITY): Payer: Self-pay | Admitting: Pharmacist

## 2021-10-22 ENCOUNTER — Other Ambulatory Visit (HOSPITAL_COMMUNITY): Payer: Self-pay

## 2021-10-22 NOTE — Telephone Encounter (Signed)
Pharmacy Transitions of Care Follow-up Telephone Call  Date of discharge: 10/10/21  Discharge Diagnosis: s/p bilateral iliac artery stenting  How have you been since you were released from the hospital?  Doing alright  Medication changes made at discharge:  - START: Plavix and Atorvastatin  Medication changes verified by the patient? Yes     Medication Accessibility:  [facility] [address]   Was the patient provided with refills on discharged medications? Y   Have all prescriptions been transferred from [facility] to home pharmacy?  Yes  Is the patient able to afford medications? Y Notable copays: $2.03/30 days    Medication Review: -Patient declined medication review Follow-up Appointments:  PCP Hospital f/u appt confirmed?  Scheduled to see Andrey Campanile, A. on 10/30/21 @ 1pm.   Specialist Hospital f/u appt confirmed?  Scheduled to see Hawkins, T. on 01/13/22 @ 0940.   If their condition worsens, is the pt aware to call PCP or go to the Emergency Dept.? Yes  Final Patient Assessment:   -Pt is doing alright   -Declined patient education -Pt has post discharge appointment and [facility] [address]  Dorethea Clan, PharmD

## 2021-10-30 ENCOUNTER — Encounter: Payer: Self-pay | Admitting: Family Medicine

## 2021-10-30 ENCOUNTER — Encounter (INDEPENDENT_AMBULATORY_CARE_PROVIDER_SITE_OTHER): Payer: Self-pay

## 2021-10-30 ENCOUNTER — Ambulatory Visit: Payer: 59 | Admitting: Family Medicine

## 2021-10-30 VITALS — BP 132/86 | HR 59 | Temp 98.1°F | Resp 16 | Ht 67.0 in | Wt 223.0 lb

## 2021-10-30 DIAGNOSIS — Z7689 Persons encountering health services in other specified circumstances: Secondary | ICD-10-CM

## 2021-10-30 DIAGNOSIS — E785 Hyperlipidemia, unspecified: Secondary | ICD-10-CM | POA: Diagnosis not present

## 2021-10-30 DIAGNOSIS — Z1211 Encounter for screening for malignant neoplasm of colon: Secondary | ICD-10-CM

## 2021-10-30 NOTE — Progress Notes (Signed)
New Patient Office Visit  Subjective:  Patient ID: Devin Guzman, male    DOB: 20-May-1961  Age: 61 y.o. MRN: [id number]  CC:  Chief Complaint  Patient presents with   Establish Care    HPI [name] presents for to establish care and for review of hyperlipidemia with med refill. Patient denies acute complaints or concerns.   No past medical history on file.  Past Surgical History:  Procedure Laterality Date   ABDOMINAL AORTOGRAM W/LOWER EXTREMITY N/A 10/10/2021   Procedure: ABDOMINAL AORTOGRAM W/LOWER EXTREMITY;  Surgeon: Leonie Douglas, MD;  Location: [facility] INVASIVE CV LAB;  Service: Cardiovascular;  Laterality: N/A;   PERIPHERAL VASCULAR INTERVENTION Bilateral 10/10/2021   Procedure: PERIPHERAL VASCULAR INTERVENTION;  Surgeon: Leonie Douglas, MD;  Location: [facility] INVASIVE CV LAB;  Service: Cardiovascular;  Laterality: Bilateral;    Family History  Problem Relation Age of Onset   Hypertension Mother     Social History   Socioeconomic History   Marital status: Single    Spouse name: Not on file   Number of children: Not on file   Years of education: Not on file   Highest education level: Not on file  Occupational History   Not on file  Tobacco Use   Smoking status: Every Day    Packs/day: 0.50    Types: Cigarettes   Smokeless tobacco: Never  Vaping Use   Vaping Use: Never used  Substance and Sexual Activity   Alcohol use: Yes    Alcohol/week: 3.0 standard drinks    Types: 3 Cans of beer per week   Drug use: No   Sexual activity: Not on file  Other Topics Concern   Not on file  Social History Narrative   Not on file   Social Determinants of Health   Financial Resource Strain: Not on file  Food Insecurity: Not on file  Transportation Needs: Not on file  Physical Activity: Not on file  Stress: Not on file  Social Connections: Not on file  Intimate Partner Violence: Not on file    ROS Review of Systems  All other systems reviewed and are  negative.  Objective:   Today's Vitals: BP 132/86    Pulse (!) 59    Temp 98.1 F (36.7 C) (Oral)    Resp 16    Ht 5\' 7"  (1.702 m)    Wt 223 lb (101.2 kg)    SpO2 97%    BMI 34.93 kg/m   Physical Exam Vitals and nursing note reviewed.  Constitutional:      General: He is not in acute distress. Cardiovascular:     Rate and Rhythm: Normal rate and regular rhythm.  Pulmonary:     Effort: Pulmonary effort is normal.     Breath sounds: Normal breath sounds.  Neurological:     General: No focal deficit present.     Mental Status: He is alert and oriented to person, place, and time.    Assessment & Plan:   1. Hyperlipidemia, unspecified hyperlipidemia type Continue present management. Meds refilled.   2. Screening for colon cancer Referral for cologuard - Cologuard  3. Encounter to establish care     Outpatient Encounter Medications as of 10/30/2021  Medication Sig   aspirin EC 81 MG tablet Take 81 mg by mouth daily. Swallow whole.   atorvastatin (LIPITOR) 40 MG tablet Take 1 tablet (40 mg total) by mouth daily.   clopidogrel (PLAVIX) 75 MG tablet Take 1 tablet (75 mg  total) by mouth daily.   No facility-administered encounter medications on file as of 10/30/2021.    Follow-up: Return in about 3 months (around 01/28/2022) for physical.   Devin Raymond, MD

## 2021-11-03 ENCOUNTER — Telehealth: Payer: Self-pay | Admitting: Family Medicine

## 2021-11-03 NOTE — Telephone Encounter (Signed)
Pt called in stating he has chronic pain in his feet but over the past few weeks the pain has increased, no new injury at this time per patient. Pt is requesting a new RX for pain medication for the pain in his feet, please advise patient if he needs an appt for same

## 2021-11-04 NOTE — Telephone Encounter (Signed)
Patient was called back. Patient was advise to call office and make appt

## 2021-11-05 ENCOUNTER — Ambulatory Visit: Payer: 59 | Admitting: Family Medicine

## 2021-11-05 ENCOUNTER — Encounter: Payer: Self-pay | Admitting: Family Medicine

## 2021-11-05 ENCOUNTER — Other Ambulatory Visit: Payer: Self-pay

## 2021-11-05 VITALS — BP 129/83 | HR 61 | Temp 98.4°F | Resp 16 | Wt 219.2 lb

## 2021-11-05 DIAGNOSIS — M79671 Pain in right foot: Secondary | ICD-10-CM | POA: Diagnosis not present

## 2021-11-05 DIAGNOSIS — M79672 Pain in left foot: Secondary | ICD-10-CM | POA: Diagnosis not present

## 2021-11-05 MED ORDER — METHYLPREDNISOLONE 4 MG PO TBPK: ORAL_TABLET | ORAL | 0 refills | Status: DC

## 2021-11-05 NOTE — Progress Notes (Signed)
Established Patient Office Visit  Subjective:  Patient ID: Devin Guzman, male    DOB: [birth year]  Age: 61 y.o. MRN: [id number]  CC:  Chief Complaint  Patient presents with   Foot Pain    HPI Devin Guzman presents for complaint of bilateral foot pain, particularly when walking. Patient denies known trauma or injury. Reports pain is intermittent.   No past medical history on file.  Past Surgical History:  Procedure Laterality Date   ABDOMINAL AORTOGRAM W/LOWER EXTREMITY N/A 10/10/2021   Procedure: ABDOMINAL AORTOGRAM W/LOWER EXTREMITY;  Surgeon: Leonie Douglas, MD;  Location: [facility];  Service: Cardiovascular;  Laterality: N/A;   PERIPHERAL VASCULAR INTERVENTION Bilateral 10/10/2021   Procedure: PERIPHERAL VASCULAR INTERVENTION;  Surgeon: Leonie Douglas, MD;  Location: [facility];  Service: Cardiovascular;  Laterality: Bilateral;    Family History  Problem Relation Age of Onset   Hypertension Mother     Social History   Socioeconomic History   Marital status: Single    Spouse name: Not on file   Number of children: Not on file   Years of education: Not on file   Highest education level: Not on file  Occupational History   Not on file  Tobacco Use   Smoking status: Every Day    Packs/day: 0.50    Types: Cigarettes   Smokeless tobacco: Never  Vaping Use   Vaping Use: Never used  Substance and Sexual Activity   Alcohol use: Yes    Alcohol/week: 3.0 standard drinks    Types: 3 Cans of beer per week   Drug use: No   Sexual activity: Not on file  Other Topics Concern   Not on file  Social History Narrative   Not on file   Social Determinants of Health   Financial Resource Strain: Not on file  Food Insecurity: Not on file  Transportation Needs: Not on file  Physical Activity: Not on file  Stress: Not on file  Social Connections: Not on file  Intimate Partner Violence: Not on file    ROS Review of Systems  All other systems  reviewed and are negative.  Objective:   Today's Vitals: BP 129/83    Pulse 61    Temp 98.4 F (36.9 C) (Oral)    Resp 16    Wt 219 lb 3.2 oz (99.4 kg)    SpO2 95%    BMI 34.33 kg/m   Physical Exam Vitals and nursing note reviewed.  Constitutional:      General: He is not in acute distress. Cardiovascular:     Rate and Rhythm: Normal rate and regular rhythm.  Pulmonary:     Effort: Pulmonary effort is normal.     Breath sounds: Normal breath sounds.  Musculoskeletal:     Right foot: Tenderness present.     Left foot: Tenderness present.  Neurological:     General: No focal deficit present.     Mental Status: He is alert and oriented to person, place, and time.     Gait: Gait normal.    Assessment & Plan:   1. Pain in both feet Patient defers further eval/mgt. Steroid dosepak prescribed. Tylenol/nsaids prn. monitor  Outpatient Encounter Medications as of 11/05/2021  Medication Sig   aspirin EC 81 MG tablet Take 81 mg by mouth daily. Swallow whole.   atorvastatin (LIPITOR) 40 MG tablet Take 1 tablet (40 mg total) by mouth daily.   clopidogrel (PLAVIX) 75 MG tablet Take 1  tablet (75 mg total) by mouth daily.   No facility-administered encounter medications on file as of 11/05/2021.    Follow-up: No follow-ups on file.   Tommie Raymond, MD

## 2021-11-05 NOTE — Progress Notes (Signed)
Patient is here with c/o foot pain when he is walking. Patient said it only hurts mostly when walking.

## 2021-11-06 ENCOUNTER — Encounter: Payer: Self-pay | Admitting: Family Medicine

## 2021-11-07 ENCOUNTER — Telehealth: Payer: Self-pay

## 2021-11-07 NOTE — Telephone Encounter (Signed)
Patient is calling in wondering if he can chase a beer with his medications.

## 2021-11-07 NOTE — Telephone Encounter (Signed)
Patient was advise  to call the pharmacy

## 2021-12-15 LAB — COLOGUARD: COLOGUARD: NEGATIVE

## 2021-12-27 ENCOUNTER — Other Ambulatory Visit (HOSPITAL_COMMUNITY): Payer: Self-pay

## 2022-01-12 NOTE — Progress Notes (Signed)
[facility] [place] ?PROGRESS NOTE ? ?ASSESSMENT / PLAN: ?Devin Guzman is a 61 y.o. male with atherosclerosis of native arteries of bilateral lower extremities causing intermittent claudication. patients with asymptomatic peripheral arterial disease or claudication have a 1-2% risk of developing chronic limb threatening ischemia, but a 15-30% risk of mortality in the next 5 years. Intervention should only be considered for medically optimized patients with disabling symptoms.  ? ?Recommend the following which can slow the progression of atherosclerosis and reduce the risk of major adverse cardiac / limb events:  ?Complete cessation from all tobacco products. ?Blood glucose control with goal A1c < 7%. ?Blood pressure control with goal blood pressure < 140/90 mmHg. ?Lipid reduction therapy with goal LDL-C <100 mg/dL (<18 if symptomatic from PAD).  ?Aspirin 81mg  PO QD.  ?May discontinue Plavix ?Atorvastatin 40-80mg  PO QD (or other "high intensity" statin therapy). ? ?Follow up with me in 6 months with repeat ABI. Sooner should CLTI symptoms develop. ? ?SUBJECTIVE: ?Some improvement. Claudication symptoms remain, but only after a moderate distance. No wounds on his feet. No symptoms typical of rest pain. ? ?OBJECTIVE: ?BP 140/84 (BP Location: Left Arm, Patient Position: Sitting, Cuff Size: Large)   Pulse (!) 55   Temp 98.1 ?F (36.7 ?C)   Resp 20   Ht 5\' 7"  (1.702 m)   Wt 219 lb (99.3 kg)   SpO2 100%   BMI 34.30 kg/m?  ? ?No acute distress ?Regular rate and rhythm ?Unlabored breathing ?No palpable pedal pulses ? ? ? ?  Latest Ref Rng & Units 10/10/2021  ?  7:55 AM 03/02/2010  ?  9:52 AM 01/06/2010  ?  5:52 AM  ?CBC  ?WBC 4.0 - 10.5 K/uL  6.1   5.8    ?Hemoglobin 13.0 - 17.0 g/dL 03/04/2010   01/08/2010   29.9    ?Hematocrit 39.0 - 52.0 % 46.0   43.0   43.2    ?Platelets 150 - 400 K/uL  172   154    ?  ? ? ?  Latest Ref Rng & Units 10/10/2021  ?  7:55 AM 03/02/2010  ?  9:52 AM 01/06/2010  ?  5:52 AM  ?CMP   ?Glucose 70 - 99 mg/dL 03/04/2010   01/08/2010   789    ?BUN 6 - 20 mg/dL 11   7   9     ?Creatinine 0.61 - 1.24 mg/dL 381   017      ?Sodium 135 - 145 mmol/L 137   134   139    ?Potassium 3.5 - 5.1 mmol/L 4.3   4.3   4.0    ?Chloride 98 - 111 mmol/L 104   102   103    ?CO2 19 - 32 mEq/L  25   30    ?Calcium 8.4 - 10.5 mg/dL  9.2   9.4    ?Total Protein 6.0 - 8.3 g/dL  7.2     ?Total Bilirubin 0.3 - 1.2 mg/dL  0.5     ?Alkaline Phos 39 - 117 U/L  78     ?AST 0 - 37 U/L  23     ?ALT 0 - 53 U/L  29     ? ? ?CrCl cannot be calculated (Patient's most recent lab result is older than the maximum 21 days allowed.). ? ? ?+-------+-----------+-----------+------------+------------+  ?ABI/TBIToday's ABIToday's TBIPrevious ABIPrevious TBI  ?+-------+-----------+-----------+------------+------------+  ?Right  0.43       0.14  0.28        0             ?+-------+-----------+-----------+------------+------------+  ?Left   0.34       0.21       0.43        0             ?+-------+-----------+-----------+------------+------------+  ? ?Rande Brunt. Lenell Antu, MD ?[facility] [place] ?Office Phone Number: [phone] ?01/12/2022 6:12 PM ? ? ? ?

## 2022-01-13 ENCOUNTER — Ambulatory Visit: Payer: 59 | Admitting: Vascular Surgery

## 2022-01-13 ENCOUNTER — Ambulatory Visit (HOSPITAL_COMMUNITY)
Admission: RE | Admit: 2022-01-13 | Discharge: 2022-01-13 | Disposition: A | Discharge status: Home / Self Care | Payer: 59 | Source: Ambulatory Visit | Attending: Vascular Surgery | Admitting: Vascular Surgery

## 2022-01-13 ENCOUNTER — Encounter: Payer: Self-pay | Admitting: Vascular Surgery

## 2022-01-13 VITALS — BP 140/84 | HR 55 | Temp 98.1°F | Resp 20 | Ht 67.0 in | Wt 219.0 lb

## 2022-01-13 DIAGNOSIS — I70213 Atherosclerosis of native arteries of extremities with intermittent claudication, bilateral legs: Secondary | ICD-10-CM | POA: Diagnosis not present

## 2022-01-13 DIAGNOSIS — I739 Peripheral vascular disease, unspecified: Secondary | ICD-10-CM | POA: Insufficient documentation

## 2022-01-21 ENCOUNTER — Other Ambulatory Visit: Payer: Self-pay | Admitting: *Deleted

## 2022-01-21 DIAGNOSIS — I739 Peripheral vascular disease, unspecified: Secondary | ICD-10-CM

## 2022-01-21 DIAGNOSIS — I70213 Atherosclerosis of native arteries of extremities with intermittent claudication, bilateral legs: Secondary | ICD-10-CM

## 2022-01-26 ENCOUNTER — Encounter: Payer: 59 | Admitting: Family Medicine

## 2022-03-11 ENCOUNTER — Encounter: Payer: Self-pay | Admitting: Family Medicine

## 2022-03-11 ENCOUNTER — Ambulatory Visit (INDEPENDENT_AMBULATORY_CARE_PROVIDER_SITE_OTHER): Payer: 59 | Admitting: Family Medicine

## 2022-03-11 VITALS — BP 124/81 | HR 60 | Temp 98.1°F | Resp 16 | Ht 67.0 in | Wt 221.6 lb

## 2022-03-11 DIAGNOSIS — Z13220 Encounter for screening for lipoid disorders: Secondary | ICD-10-CM

## 2022-03-11 DIAGNOSIS — Z1159 Encounter for screening for other viral diseases: Secondary | ICD-10-CM

## 2022-03-11 DIAGNOSIS — I6529 Occlusion and stenosis of unspecified carotid artery: Secondary | ICD-10-CM | POA: Insufficient documentation

## 2022-03-11 DIAGNOSIS — Z13228 Encounter for screening for other metabolic disorders: Secondary | ICD-10-CM

## 2022-03-11 DIAGNOSIS — Z1329 Encounter for screening for other suspected endocrine disorder: Secondary | ICD-10-CM | POA: Diagnosis not present

## 2022-03-11 DIAGNOSIS — I639 Cerebral infarction, unspecified: Secondary | ICD-10-CM | POA: Insufficient documentation

## 2022-03-11 DIAGNOSIS — Z130 Encounter for screening for diseases of the blood and blood-forming organs and certain disorders involving the immune mechanism: Secondary | ICD-10-CM | POA: Diagnosis not present

## 2022-03-11 DIAGNOSIS — Z125 Encounter for screening for malignant neoplasm of prostate: Secondary | ICD-10-CM

## 2022-03-11 DIAGNOSIS — Z0000 Encounter for general adult medical examination without abnormal findings: Secondary | ICD-10-CM

## 2022-03-11 DIAGNOSIS — Z8673 Personal history of transient ischemic attack (TIA), and cerebral infarction without residual deficits: Secondary | ICD-10-CM | POA: Insufficient documentation

## 2022-03-11 DIAGNOSIS — Z720 Tobacco use: Secondary | ICD-10-CM | POA: Insufficient documentation

## 2022-03-11 NOTE — Progress Notes (Signed)
Established Patient Office Visit  Subjective    Patient ID: Devin Guzman, male    DOB: August 02, 1961  Age: 61 y.o. MRN: [id number]  CC:  Chief Complaint  Patient presents with   Annual Exam    HPI Devin Guzman presents for routine annual exam.    Outpatient Encounter Medications as of 03/11/2022  Medication Sig   aspirin EC 81 MG tablet Take 81 mg by mouth daily. Swallow whole.   atorvastatin (LIPITOR) 40 MG tablet Take 1 tablet (40 mg total) by mouth daily.   clopidogrel (PLAVIX) 75 MG tablet Take 1 tablet (75 mg total) by mouth daily.   No facility-administered encounter medications on file as of 03/11/2022.    Past Medical History:  Diagnosis Date   Peripheral vascular disease [facility])     Past Surgical History:  Procedure Laterality Date   ABDOMINAL AORTOGRAM W/LOWER EXTREMITY N/A 10/10/2021   Procedure: ABDOMINAL AORTOGRAM W/LOWER EXTREMITY;  Surgeon: Cherre Robins, MD;  Location: [place] [facility];  Service: Cardiovascular;  Laterality: N/A;   PERIPHERAL VASCULAR INTERVENTION Bilateral 10/10/2021   Procedure: PERIPHERAL VASCULAR INTERVENTION;  Surgeon: Cherre Robins, MD;  Location: [place] [facility];  Service: Cardiovascular;  Laterality: Bilateral;    Family History  Problem Relation Age of Onset   Hypertension Mother     Social History   Socioeconomic History   Marital status: Single    Spouse name: Not on file   Number of children: Not on file   Years of education: Not on file   Highest education level: Not on file  Occupational History   Not on file  Tobacco Use   Smoking status: Every Day    Packs/day: 0.25    Types: Cigarettes   Smokeless tobacco: Never  Vaping Use   Vaping Use: Never used  Substance and Sexual Activity   Alcohol use: Yes    Alcohol/week: 3.0 standard drinks    Types: 3 Cans of beer per week   Drug use: No   Sexual activity: Not on file  Other Topics Concern   Not on file  Social History Narrative   Not on file    Social Determinants of Health   Financial Resource Strain: Not on file  Food Insecurity: Not on file  Transportation Needs: Not on file  Physical Activity: Not on file  Stress: Not on file  Social Connections: Not on file  Intimate Partner Violence: Not on file    Review of Systems  All other systems reviewed and are negative.      Objective    BP 124/81   Pulse 60   Temp 98.1 F (36.7 C) (Oral)   Resp 16   Ht 5' 7"  (1.702 m)   Wt 221 lb 9.6 oz (100.5 kg)   SpO2 97%   BMI 34.71 kg/m   Physical Exam Vitals and nursing note reviewed.  Constitutional:      General: He is not in acute distress. HENT:     Head: Normocephalic and atraumatic.     Right Ear: Tympanic membrane, ear canal and external ear normal.     Left Ear: Tympanic membrane, ear canal and external ear normal.     Nose: Nose normal.     Mouth/Throat:     Mouth: Mucous membranes are moist.     Pharynx: Oropharynx is clear.  Eyes:     Conjunctiva/sclera: Conjunctivae normal.     Pupils: Pupils are equal, round, and reactive to light.  Neck:     Thyroid: No thyromegaly.  Cardiovascular:     Rate and Rhythm: Normal rate and regular rhythm.     Heart sounds: Normal heart sounds. No murmur heard. Pulmonary:     Effort: Pulmonary effort is normal.     Breath sounds: Normal breath sounds.  Abdominal:     General: There is no distension.     Palpations: Abdomen is soft. There is no mass.     Tenderness: There is no abdominal tenderness.     Hernia: There is no hernia in the left inguinal area or right inguinal area.  Genitourinary:    Penis: Normal and circumcised.      Testes: Normal.  Musculoskeletal:        General: Normal range of motion.     Cervical back: Normal range of motion and neck supple.     Right lower leg: No edema.     Left lower leg: No edema.  Skin:    General: Skin is warm and dry.  Neurological:     General: No focal deficit present.     Mental Status: He is alert and  oriented to person, place, and time. Mental status is at baseline.  Psychiatric:        Mood and Affect: Mood normal.        Behavior: Behavior normal.        Assessment & Plan:   1. Annual physical exam Routine labs ordered - CMP14+EGFR  2. Screening for deficiency anemia  - CBC with Differential  3. Screening for lipid disorders  - Lipid Panel  4. Screening for endocrine/metabolic/immunity disorders  - Hemoglobin A1c - TSH  5. Need for hepatitis C screening test   6. Screening for prostate cancer  - PSA    No follow-ups on file.   Becky Sax, MD

## 2022-03-12 LAB — CMP14+EGFR
ALT: 22 IU/L (ref 0–44)
AST: 18 IU/L (ref 0–40)
Albumin/Globulin Ratio: 1.3 (ref 1.2–2.2)
Albumin: 3.8 g/dL (ref 3.8–4.8)
Alkaline Phosphatase: 128 IU/L — ABNORMAL HIGH (ref 44–121)
BUN/Creatinine Ratio: 9 — ABNORMAL LOW (ref 10–24)
BUN: 8 mg/dL (ref 8–27)
Bilirubin Total: 0.4 mg/dL (ref 0.0–1.2)
CO2: 24 mmol/L (ref 20–29)
Calcium: 9.3 mg/dL (ref 8.6–10.2)
Chloride: 99 mmol/L (ref 96–106)
Creatinine, Ser: 0.94 mg/dL (ref 0.76–1.27)
Globulin, Total: 3 g/dL (ref 1.5–4.5)
Glucose: 101 mg/dL — ABNORMAL HIGH (ref 70–99)
Potassium: 5 mmol/L (ref 3.5–5.2)
Sodium: 137 mmol/L (ref 134–144)
Total Protein: 6.8 g/dL (ref 6.0–8.5)
eGFR: 92 mL/min/{1.73_m2} (ref >59)

## 2022-03-12 LAB — CBC WITH DIFFERENTIAL/PLATELET
Basophils Absolute: 0.1 10*3/uL (ref 0.0–0.2)
Basos: 1 %
EOS (ABSOLUTE): 0.2 10*3/uL (ref 0.0–0.4)
Eos: 4 %
Hematocrit: 44.1 % (ref 37.5–51.0)
Hemoglobin: 14.8 g/dL (ref 13.0–17.7)
Immature Grans (Abs): 0 10*3/uL (ref 0.0–0.1)
Immature Granulocytes: 0 %
Lymphocytes Absolute: 2.6 10*3/uL (ref 0.7–3.1)
Lymphs: 43 %
MCH: 31.3 pg (ref 26.6–33.0)
MCHC: 33.6 g/dL (ref 31.5–35.7)
MCV: 93 fL (ref 79–97)
Monocytes Absolute: 0.6 10*3/uL (ref 0.1–0.9)
Monocytes: 10 %
Neutrophils Absolute: 2.6 10*3/uL (ref 1.4–7.0)
Neutrophils: 42 %
Platelets: 234 10*3/uL (ref 150–450)
RBC: 4.73 x10E6/uL (ref 4.14–5.80)
RDW: 13.3 % (ref 11.6–15.4)
WBC: 6.1 10*3/uL (ref 3.4–10.8)

## 2022-03-12 LAB — LIPID PANEL
Chol/HDL Ratio: 5 ratio (ref 0.0–5.0)
Cholesterol, Total: 179 mg/dL (ref 100–199)
HDL: 36 mg/dL — ABNORMAL LOW (ref >39)
LDL Chol Calc (NIH): 121 mg/dL — ABNORMAL HIGH (ref 0–99)
Triglycerides: 118 mg/dL (ref 0–149)
VLDL Cholesterol Cal: 22 mg/dL (ref 5–40)

## 2022-03-12 LAB — TSH: TSH: 1.49 u[IU]/mL (ref 0.450–4.500)

## 2022-03-12 LAB — HEMOGLOBIN A1C
Est. average glucose Bld gHb Est-mCnc: 123 mg/dL
Hgb A1c MFr Bld: 5.9 % — ABNORMAL HIGH (ref 4.8–5.6)

## 2022-03-12 LAB — PSA: Prostate Specific Ag, Serum: 0.5 ng/mL (ref 0.0–4.0)

## 2022-09-19 ENCOUNTER — Encounter (HOSPITAL_COMMUNITY): Payer: Self-pay

## 2022-09-19 ENCOUNTER — Other Ambulatory Visit: Payer: Self-pay

## 2022-09-19 ENCOUNTER — Emergency Department (HOSPITAL_COMMUNITY)
Admission: EM | Admit: 2022-09-19 | Discharge: 2022-09-19 | Disposition: A | Discharge status: Home / Self Care | Payer: 59 | Attending: Emergency Medicine | Admitting: Emergency Medicine

## 2022-09-19 ENCOUNTER — Emergency Department (HOSPITAL_COMMUNITY): Payer: 59

## 2022-09-19 DIAGNOSIS — S060X0A Concussion without loss of consciousness, initial encounter: Secondary | ICD-10-CM | POA: Insufficient documentation

## 2022-09-19 DIAGNOSIS — Z23 Encounter for immunization: Secondary | ICD-10-CM | POA: Diagnosis not present

## 2022-09-19 DIAGNOSIS — Z7902 Long term (current) use of antithrombotics/antiplatelets: Secondary | ICD-10-CM | POA: Diagnosis not present

## 2022-09-19 DIAGNOSIS — Z79899 Other long term (current) drug therapy: Secondary | ICD-10-CM | POA: Insufficient documentation

## 2022-09-19 DIAGNOSIS — F10129 Alcohol abuse with intoxication, unspecified: Secondary | ICD-10-CM | POA: Insufficient documentation

## 2022-09-19 DIAGNOSIS — S01111A Laceration without foreign body of right eyelid and periocular area, initial encounter: Secondary | ICD-10-CM | POA: Insufficient documentation

## 2022-09-19 DIAGNOSIS — W01198A Fall on same level from slipping, tripping and stumbling with subsequent striking against other object, initial encounter: Secondary | ICD-10-CM | POA: Insufficient documentation

## 2022-09-19 DIAGNOSIS — S0990XA Unspecified injury of head, initial encounter: Secondary | ICD-10-CM | POA: Diagnosis present

## 2022-09-19 MED ORDER — BACITRACIN ZINC 500 UNIT/GM EX OINT
TOPICAL_OINTMENT | Freq: Two times a day (BID) | CUTANEOUS | Status: DC
  Administered 2022-09-19: 1 via TOPICAL
  Filled 2022-09-19: qty 0.9

## 2022-09-19 MED ORDER — TETANUS-DIPHTH-ACELL PERTUSSIS 5-2.5-18.5 LF-MCG/0.5 IM SUSY
0.5000 mL | PREFILLED_SYRINGE | Freq: Once | INTRAMUSCULAR | Status: AC
  Administered 2022-09-19: 0.5 mL via INTRAMUSCULAR
  Filled 2022-09-19: qty 0.5

## 2022-09-19 NOTE — Discharge Instructions (Addendum)
You may keep a topical antibiotic on your cut, it is very small and does not need any stitches.  Please allow an ice pack to help keep the swelling down.  Use an ice pack intermittently for no more than 5 to 10 minutes at a time.  Tylenol or ibuprofen for pain.  See your doctor within 1 week for recheck or return to the ER for worsening symptoms

## 2022-09-19 NOTE — ED Provider Notes (Signed)
[facility] EMERGENCY DEPARTMENT Provider Note   CSN: [id number] Arrival date & time: 09/19/22  1939     History  Chief Complaint  Patient presents with   Devin Guzman is a 61 y.o. male.   Fall   This patient is a 61 year old male, presenting today with a complaint of a head injury, this occurred while he was at a bar drinking, he had multiple drinks and was in the bathroom, he slipped and fell falling into a sink where he struck the right side of his head causing a small laceration to his right eyebrow.  This occurred just prior to arrival, symptoms are persistent, paramedics noted that there was a small amount of bleeding and placed a dressing, the patient had no neck pain or other complaints.  He was able to assist with walking to the stretcher and was noted to have slurred speech.  The patient endorses "drinking too much" tonight.    Home Medications Prior to Admission medications   Medication Sig Start Date End Date Taking? Authorizing Provider  atorvastatin (LIPITOR) 40 MG tablet Take 1 tablet (40 mg total) by mouth daily. 10/10/21 10/10/22 Yes Leonie Douglas, MD  clopidogrel (PLAVIX) 75 MG tablet Take 1 tablet (75 mg total) by mouth daily. 10/10/21 10/10/22 Yes Leonie Douglas, MD      Allergies    Codeine    Review of Systems   Review of Systems  All other systems reviewed and are negative.   Physical Exam Updated Vital Signs BP 138/79   Pulse (!) 59   Temp 97.6 F (36.4 C) (Oral)   Resp 18   Ht 1.702 m (5\' 7" )   Wt 116.1 kg   SpO2 98%   BMI 40.10 kg/m  Physical Exam Vitals and nursing note reviewed.  Constitutional:      General: He is not in acute distress.    Appearance: He is well-developed.  HENT:     Head: Normocephalic.     Comments: Small sub cm laceration to the R eyebrown and R upper lid.  No other facial trauma, no malocclusion.    Mouth/Throat:     Pharynx: No oropharyngeal exudate.  Eyes:     General: No  scleral icterus.       Right eye: No discharge.        Left eye: No discharge.     Conjunctiva/sclera: Conjunctivae normal.     Pupils: Pupils are equal, round, and reactive to light.  Neck:     Thyroid: No thyromegaly.     Vascular: No JVD.  Cardiovascular:     Rate and Rhythm: Normal rate and regular rhythm.     Heart sounds: Normal heart sounds. No murmur heard.    No friction rub. No gallop.  Pulmonary:     Effort: Pulmonary effort is normal. No respiratory distress.     Breath sounds: Normal breath sounds. No wheezing or rales.  Abdominal:     General: Bowel sounds are normal. There is no distension.     Palpations: Abdomen is soft. There is no mass.     Tenderness: There is no abdominal tenderness.  Musculoskeletal:        General: No tenderness. Normal range of motion.     Cervical back: Normal range of motion and neck supple.     Right lower leg: No edema.     Left lower leg: No edema.  Lymphadenopathy:     Cervical:  No cervical adenopathy.  Skin:    General: Skin is warm and dry.     Findings: No erythema or rash.  Neurological:     Mental Status: He is alert.     Coordination: Coordination normal.     Comments: Speech, the patient is moving slowly but able to answer all of my questions, no focal deficits with his arms or legs, cranial nerves III through XII appear to be normal  Psychiatric:        Behavior: Behavior normal.     ED Results / Procedures / Treatments   Labs (all labs ordered are listed, but only abnormal results are displayed) Labs Reviewed - No data to display  EKG None  Radiology CT Cervical Spine Wo Contrast  Result Date: 09/19/2022 CLINICAL DATA:  Trauma EXAM: CT CERVICAL SPINE WITHOUT CONTRAST TECHNIQUE: Multidetector CT imaging of the cervical spine was performed without intravenous contrast. Multiplanar CT image reconstructions were also generated. RADIATION DOSE REDUCTION: This exam was performed according to the departmental  dose-optimization program which includes automated exposure control, adjustment of the mA and/or kV according to patient size and/or use of iterative reconstruction technique. COMPARISON:  None Available. FINDINGS: Alignment: Alignment is grossly anatomic. Skull base and vertebrae: No acute fracture. No primary bone lesion or focal pathologic process. Soft tissues and spinal canal: No prevertebral fluid or swelling. No visible canal hematoma. Disc levels: There is diffuse spondylosis throughout the cervical spine. Left predominant neural foraminal encroachment from C3-4 through C5-6. Upper chest: Airway is patent.  Lung apices are clear. Other: Reconstructed images demonstrate no additional findings. IMPRESSION: 1. No acute cervical spine fracture. 2. Diffuse cervical spondylosis. Electronically Signed   By: Sharlet Salina M.D.   On: 09/19/2022 20:32   CT Head Wo Contrast  Result Date: 09/19/2022 CLINICAL DATA:  Trauma EXAM: CT HEAD WITHOUT CONTRAST TECHNIQUE: Contiguous axial images were obtained from the base of the skull through the vertex without intravenous contrast. RADIATION DOSE REDUCTION: This exam was performed according to the departmental dose-optimization program which includes automated exposure control, adjustment of the mA and/or kV according to patient size and/or use of iterative reconstruction technique. COMPARISON:  03/02/2010 FINDINGS: Brain: No acute infarct or hemorrhage. Scattered hypodensities within the periventricular white matter, right basal ganglia, central pons consistent with chronic small vessel ischemic change. Lateral ventricles and remaining midline structures are unremarkable. No acute extra-axial fluid collections. No mass effect. Vascular: No hyperdense vessel or unexpected calcification. Skull: Normal. Negative for fracture or focal lesion. Sinuses/Orbits: Mucosal thickening within the maxillary sinuses. Other: None. IMPRESSION: 1. No acute intracranial process.  Electronically Signed   By: Sharlet Salina M.D.   On: 09/19/2022 20:30    Procedures Procedures    Medications Ordered in ED Medications  bacitracin ointment (0 Applications Topical Hold 09/19/22 2127)  Tdap (BOOSTRIX) injection 0.5 mL (0.5 mLs Intramuscular Given 09/19/22 1957)    ED Course/ Medical Decision Making/ A&P                           Medical Decision Making Amount and/or Complexity of Data Reviewed Radiology: ordered.  Risk OTC drugs. Prescription drug management.   This patient presents to the ED for concern of trauma to the head and slurred speech, this involves an extensive number of treatment options, and is a complaint that carries with it a high risk of complications and morbidity.  The differential diagnosis includes stroke, trauma, ETOH intoxication - r/o fracture /  bleed with CT of head and C spine.   Co morbidities that complicate the patient evaluation  ETOH intoxication Plavix therapy (leg PAD)   Additional history obtained:  Additional history obtained from EMR External records from outside source obtained and reviewed including admit in 12/22 for vasacular care.   Lab Tests:  I Ordered, and personally interpreted labs.  The pertinent results include: None   Imaging Studies ordered:  I ordered imaging studies including CT scan of the head and cervical spine I independently visualized and interpreted imaging which showed no acute fractures or intracranial injury I agree with the radiologist interpretation   Cardiac Monitoring: / EKG:  The patient was maintained on a cardiac monitor.  I personally viewed and interpreted the cardiac monitored which showed an underlying rhythm of: Normal sinus rhythm   Problem List / ED Course / Critical interventions / Medication management  Minor head injury with mild concussion, Steri-Strip placed across the teeny wound I ordered medication including topical antibiotics and a sterile dressing for  facial wound Reevaluation of the patient after these medicines showed that the patient stable for discharge I have reviewed the patients home medicines and have made adjustments as needed   Social Determinants of Health:  Heavily intoxicated but improved to the point where he is able to leave with a sober ride   Test / Admission - Considered:  Consider admission but he has no signs of significant traumatic injury         Final Clinical Impression(s) / ED Diagnoses Final diagnoses:  Concussion without loss of consciousness, initial encounter  Laceration of right eyebrow, initial encounter    Rx / DC Orders ED Discharge Orders     None         Eber Hong, MD 09/19/22 2145

## 2022-09-19 NOTE — ED Triage Notes (Signed)
Pt was in the [place] and reports he fell hit his head on the sink. + ETOH. "I had to much to drink"

## 2022-10-23 ENCOUNTER — Other Ambulatory Visit: Payer: Self-pay | Admitting: Vascular Surgery

## 2022-10-28 NOTE — Telephone Encounter (Signed)
Med d/c'd on 01/13/22 at office visit

## 2022-11-03 ENCOUNTER — Ambulatory Visit (HOSPITAL_COMMUNITY): Payer: 59

## 2022-11-05 ENCOUNTER — Ambulatory Visit (HOSPITAL_COMMUNITY)
Admission: RE | Admit: 2022-11-05 | Discharge: 2022-11-05 | Disposition: A | Discharge status: Home / Self Care | Payer: 59 | Source: Ambulatory Visit | Attending: Vascular Surgery | Admitting: Vascular Surgery

## 2022-11-05 DIAGNOSIS — I70213 Atherosclerosis of native arteries of extremities with intermittent claudication, bilateral legs: Secondary | ICD-10-CM | POA: Insufficient documentation

## 2022-11-05 DIAGNOSIS — I739 Peripheral vascular disease, unspecified: Secondary | ICD-10-CM | POA: Insufficient documentation

## 2022-11-05 LAB — VAS US ABI WITH/WO TBI
Left ABI: 0.46
Right ABI: 0.54

## 2022-11-10 ENCOUNTER — Ambulatory Visit (INDEPENDENT_AMBULATORY_CARE_PROVIDER_SITE_OTHER): Payer: 59 | Admitting: Vascular Surgery

## 2022-11-10 ENCOUNTER — Encounter: Payer: Self-pay | Admitting: Vascular Surgery

## 2022-11-10 VITALS — BP 158/96 | HR 58 | Temp 98.2°F | Resp 20 | Ht 67.0 in | Wt 219.0 lb

## 2022-11-10 DIAGNOSIS — I739 Peripheral vascular disease, unspecified: Secondary | ICD-10-CM | POA: Diagnosis not present

## 2022-11-10 NOTE — Progress Notes (Signed)
[facility] [place] PROGRESS NOTE  ASSESSMENT / PLAN: Devin Guzman is a 62 y.o. male with atherosclerosis of native arteries of bilateral lower extremities causing intermittent claudication. patients with asymptomatic peripheral arterial disease or claudication have a 1-2% risk of developing chronic limb threatening ischemia, but a 15-30% risk of mortality in the next 5 years. Intervention should only be considered for medically optimized patients with disabling symptoms.   Recommend the following which can slow the progression of atherosclerosis and reduce the risk of major adverse cardiac / limb events:  Complete cessation from all tobacco products. Blood glucose control with goal A1c < 7%. Blood pressure control with goal blood pressure < 140/90 mmHg. Lipid reduction therapy with goal LDL-C <100 mg/dL (<70 if symptomatic from PAD).  Aspirin 81mg  PO QD.  May discontinue Plavix Atorvastatin 40-80mg  PO QD (or other "high intensity" statin therapy).  Follow up with me in 12 months with repeat ABI. Sooner should CLTI symptoms develop.  SUBJECTIVE: Stable claudication symptoms. No rest pain. No ulcers about the feet. Continues to smoke.  OBJECTIVE: BP (!) 158/96 (BP Location: Left Arm, Patient Position: Sitting, Cuff Size: Normal)   Pulse (!) 58   Temp 98.2 F (36.8 C)   Resp 20   Ht 5\' 7"  (1.702 m)   Wt 219 lb (99.3 kg)   SpO2 100%   BMI 34.30 kg/m   No acute distress Regular rate and rhythm Unlabored breathing No palpable pedal pulses      Latest Ref Rng & Units 03/11/2022   10:46 AM 10/10/2021    7:55 AM 03/02/2010    9:52 AM  CBC  WBC 3.4 - 10.8 x10E3/uL 6.1   6.1   Hemoglobin 13.0 - 17.7 g/dL 14.8  15.6  14.8   Hematocrit 37.5 - 51.0 % 44.1  46.0  43.0   Platelets 150 - 450 x10E3/uL 234   172         Latest Ref Rng & Units 03/11/2022   10:46 AM 10/10/2021    7:55 AM 03/02/2010    9:52 AM  CMP  Glucose 70 - 99 mg/dL 101  103  109   BUN 8  - 27 mg/dL 8  11  7    Creatinine 0.76 - 1.27 mg/dL 0.94  1.00  0.91   Sodium 134 - 144 mmol/L 137  137  134   Potassium 3.5 - 5.2 mmol/L 5.0  4.3  4.3   Chloride 96 - 106 mmol/L 99  104  102   CO2 20 - 29 mmol/L 24   25   Calcium 8.6 - 10.2 mg/dL 9.3   9.2   Total Protein 6.0 - 8.5 g/dL 6.8   7.2   Total Bilirubin 0.0 - 1.2 mg/dL 0.4   0.5   Alkaline Phos 44 - 121 IU/L 128   78   AST 0 - 40 IU/L 18   23   ALT 0 - 44 IU/L 22   29     CrCl cannot be calculated (Patient's most recent lab result is older than the maximum 21 days allowed.).    +-------+-----------+-----------+------------+------------+  ABI/TBIToday's ABIToday's TBIPrevious ABIPrevious TBI  +-------+-----------+-----------+------------+------------+  Right 0.54       0.37       0.43        0.14          +-------+-----------+-----------+------------+------------+  Left  0.46       0.39       0.34  0.21          +-------+-----------+-----------+------------+------------+   Devin Aline. Stanford Breed, MD [facility] Phone Number: [phone] 11/10/2022 9:46 AM

## 2022-11-18 ENCOUNTER — Other Ambulatory Visit: Payer: Self-pay | Admitting: Vascular Surgery

## 2023-07-08 ENCOUNTER — Ambulatory Visit: Payer: Self-pay

## 2023-07-08 NOTE — Telephone Encounter (Signed)
  Chief Complaint: Pain left side following a fall Symptoms: pain left side Frequency: sunday Pertinent Negatives: Patient denies bruising,  Disposition: [] ED /[] Urgent Care (no appt availability in office) / [x] Appointment(In office/virtual)/ []  [place] [facility]/ [] Home Care/ [] Refused Recommended Disposition /[] [place] [facility]/ []  Follow-up with PCP Additional Notes: PT was in a lounge chair when it broke. Pt states his left side hit the ground. No bruising or cuts. Pt states that his side hurts from fall, and sometimes with a deep breath.   Answer Assessment - Initial Assessment Questions 1. MECHANISM: "How did the fall happen?"     Last Sunday - chair broke 2. DOMESTIC VIOLENCE AND ELDER ABUSE SCREENING: "Did you fall because someone pushed you or tried to hurt you?" If Yes, ask: "Are you safe now?"     no 3. ONSET: "When did the fall happen?" (e.g., minutes, hours, or days ago)     sunday 4. LOCATION: "What part of the body hit the ground?" (e.g., back, buttocks, head, hips, knees, hands, head, stomach)     Ribs uo 5. INJURY: "Did you hurt (injure) yourself when you fell?" If Yes, ask: "What did you injure? Tell me more about this?" (e.g., body area; type of injury; pain severity)"     Left side 6. PAIN: "Is there any pain?" If Yes, ask: "How bad is the pain?" (e.g., Scale 1-10; or mild,  moderate, severe)   - NONE (0): No pain   - MILD (1-3): Doesn't interfere with normal activities    - MODERATE (4-7): Interferes with normal activities or awakens from sleep    - SEVERE (8-10): Excruciating pain, unable to do any normal activities      Mild moderate 7. SIZE: For cuts, bruises, or swelling, ask: "How large is it?" (e.g., inches or centimeters)      no 9. OTHER SYMPTOMS: "Do you have any other symptoms?" (e.g., dizziness, fever, weakness; new onset or worsening).      no 10. CAUSE: "What do you think caused the fall (or falling)?" (e.g., tripped, dizzy spell)        Chair broke  Protocols used: Falls and [facility]

## 2023-07-09 ENCOUNTER — Ambulatory Visit (INDEPENDENT_AMBULATORY_CARE_PROVIDER_SITE_OTHER): Payer: 59

## 2023-07-09 ENCOUNTER — Ambulatory Visit (INDEPENDENT_AMBULATORY_CARE_PROVIDER_SITE_OTHER): Payer: 59 | Admitting: Family

## 2023-07-09 ENCOUNTER — Encounter: Payer: Self-pay | Admitting: Family

## 2023-07-09 VITALS — BP 155/92 | HR 53 | Temp 97.9°F | Ht 67.0 in | Wt 211.8 lb

## 2023-07-09 DIAGNOSIS — W19XXXA Unspecified fall, initial encounter: Secondary | ICD-10-CM

## 2023-07-09 DIAGNOSIS — R109 Unspecified abdominal pain: Secondary | ICD-10-CM

## 2023-07-09 MED ORDER — PREDNISONE 10 MG PO TABS
ORAL_TABLET | ORAL | 0 refills | Status: AC
Start: 2023-07-09 — End: 2023-07-15

## 2023-07-09 NOTE — Progress Notes (Signed)
Patient ID: Devin Guzman, male    DOB: 1961/05/03  MRN: [id number]  CC: Fall  Subjective: Devin Guzman is a 62 y.o. male who presents for fall.  His concerns today include:  07/08/2023 per triage RN call note: Chief Complaint: Pain left side following a fall Symptoms: pain left side Frequency: sunday Pertinent Negatives: Patient denies bruising,  Disposition: [] ED /[] Urgent Care (no appt availability in office) / [x] Appointment(In office/virtual)/ []  [place] [facility]/ [] Home Care/ [] Refused Recommended Disposition /[] [place] [facility]/ []  Follow-up with PCP Additional Notes: PT was in a lounge chair when it broke. Pt states his left side hit the ground. No bruising or cuts. Pt states that his side hurts from fall, and sometimes with a deep breath.    Answer Assessment - Initial Assessment Questions 1. MECHANISM: "How did the fall happen?"     Last Sunday - chair broke 2. DOMESTIC VIOLENCE AND ELDER ABUSE SCREENING: "Did you fall because someone pushed you or tried to hurt you?" If Yes, ask: "Are you safe now?"     no 3. ONSET: "When did the fall happen?" (e.g., minutes, hours, or days ago)     sunday 4. LOCATION: "What part of the body hit the ground?" (e.g., back, buttocks, head, hips, knees, hands, head, stomach)     Ribs uo 5. INJURY: "Did you hurt (injure) yourself when you fell?" If Yes, ask: "What did you injure? Tell me more about this?" (e.g., body area; type of injury; pain severity)"     Left side 6. PAIN: "Is there any pain?" If Yes, ask: "How bad is the pain?" (e.g., Scale 1-10; or mild,  moderate, severe)   - NONE (0): No pain   - MILD (1-3): Doesn't interfere with normal activities    - MODERATE (4-7): Interferes with normal activities or awakens from sleep    - SEVERE (8-10): Excruciating pain, unable to do any normal activities      Mild moderate 7. SIZE: For cuts, bruises, or swelling, ask: "How large is it?" (e.g., inches or centimeters)       no 9. OTHER SYMPTOMS: "Do you have any other symptoms?" (e.g., dizziness, fever, weakness; new onset or worsening).      no 10. CAUSE: "What do you think caused the fall (or falling)?" (e.g., tripped, dizzy spell)       Chair broke  Protocols used: Falls and Falling-A-AH  Today's office visit 07/09/2023: Reports pain has improved some since his fall. Taking over-the-counter analgesics helping some. He denies red flag symptoms associated with fall, he did not hit his head, he did not lose consciousness. Using a cane to assist with ambulation. No further issues/concerns for discussion today.   Patient Active Problem List   Diagnosis Date Noted   Carotid artery stenosis 03/11/2022   Cerebrovascular accident (HCC) 03/11/2022   Tobacco user 03/11/2022     Current Outpatient Medications on File Prior to Visit  Medication Sig Dispense Refill   atorvastatin (LIPITOR) 40 MG tablet TAKE 1 TABLET BY MOUTH EVERY DAY 90 tablet 3   No current facility-administered medications on file prior to visit.    Allergies  Allergen Reactions   Codeine Other (See Comments)    Cause Shaking    Social History   Socioeconomic History   Marital status: Single    Spouse name: Not on file   Number of children: Not on file   Years of education: Not on file   Highest education level: Not  on file  Occupational History   Not on file  Tobacco Use   Smoking status: Every Day    Current packs/day: 0.25    Types: Cigarettes   Smokeless tobacco: Never  Vaping Use   Vaping status: Never Used  Substance and Sexual Activity   Alcohol use: Yes    Alcohol/week: 3.0 standard drinks of alcohol    Types: 3 Cans of beer per week   Drug use: No   Sexual activity: Not on file  Other Topics Concern   Not on file  Social History Narrative   Not on file   Social Determinants of Health   Financial Resource Strain: Not on file  Food Insecurity: Not on file  Transportation Needs: Not on file  Physical  Activity: Not on file  Stress: Not on file  Social Connections: Not on file  Intimate Partner Violence: Not on file    Family History  Problem Relation Age of Onset   Hypertension Mother     Past Surgical History:  Procedure Laterality Date   ABDOMINAL AORTOGRAM W/LOWER EXTREMITY N/A 10/10/2021   Procedure: ABDOMINAL AORTOGRAM W/LOWER EXTREMITY;  Surgeon: Leonie Douglas, MD;  Location: [facility];  Service: Cardiovascular;  Laterality: N/A;   PERIPHERAL VASCULAR INTERVENTION Bilateral 10/10/2021   Procedure: PERIPHERAL VASCULAR INTERVENTION;  Surgeon: Leonie Douglas, MD;  Location: [facility];  Service: Cardiovascular;  Laterality: Bilateral;    ROS: Review of Systems Negative except as stated above  PHYSICAL EXAM: BP (!) 155/92   Pulse (!) 53   Temp 97.9 F (36.6 C) (Oral)   Ht 5\' 7"  (1.702 m)   Wt 211 lb 12.8 oz (96.1 kg)   SpO2 97%   BMI 33.17 kg/m   Physical Exam HENT:     Head: Normocephalic and atraumatic.     Nose: Nose normal.     Mouth/Throat:     Mouth: Mucous membranes are moist.     Pharynx: Oropharynx is clear.  Eyes:     Extraocular Movements: Extraocular movements intact.     Conjunctiva/sclera: Conjunctivae normal.     Pupils: Pupils are equal, round, and reactive to light.  Cardiovascular:     Rate and Rhythm: Bradycardia present.     Pulses: Normal pulses.     Heart sounds: Normal heart sounds.  Pulmonary:     Effort: Pulmonary effort is normal.     Breath sounds: Normal breath sounds.  Musculoskeletal:        General: Normal range of motion.     Right shoulder: Normal.     Left shoulder: Normal.     Right upper arm: Normal.     Left upper arm: Normal.     Right elbow: Normal.     Left elbow: Normal.     Right forearm: Normal.     Left forearm: Normal.     Right wrist: Normal.     Left wrist: Normal.     Right hand: Normal.     Left hand: Normal.     Cervical back: Normal, normal range of motion and neck supple.      Thoracic back: Normal.     Lumbar back: Normal.     Right hip: Normal.     Left hip: Normal.     Right upper leg: Normal.     Left upper leg: Normal.     Right knee: Normal.     Left knee: Normal.     Right lower leg:  Normal.     Left lower leg: Normal.     Right ankle: Normal.     Left ankle: Normal.     Right foot: Normal.     Left foot: Normal.  Skin:    General: Skin is warm and dry.  Neurological:     General: No focal deficit present.     Mental Status: He is alert and oriented to person, place, and time.  Psychiatric:        Mood and Affect: Mood normal.        Behavior: Behavior normal.    ASSESSMENT AND PLAN: 1. Fall, initial encounter 2. Left flank pain - Diagnostic xray chest for evaluation.  - Diagnostic xray abdomen for evaluation.  - Prednisone as prescribed. Counseled on medication adherence. - Routine screening.  - Follow-up with primary provider in 1 week or sooner if needed.  - CMP14+EGFR - DG Chest 2 View; Future - DG Abd 2 Views; Future - CBC - predniSONE (DELTASONE) 10 MG tablet; Take 6 tablets (60 mg total) by mouth daily with breakfast for 1 day, THEN 5 tablets (50 mg total) daily with breakfast for 1 day, THEN 4 tablets (40 mg total) daily with breakfast for 1 day, THEN 3 tablets (30 mg total) daily with breakfast for 1 day, THEN 2 tablets (20 mg total) daily with breakfast for 1 day, THEN 1 tablet (10 mg total) daily with breakfast for 1 day.  Dispense: 21 tablet; Refill: 0     Patient was given the opportunity to ask questions.  Patient verbalized understanding of the plan and was able to repeat key elements of the plan. Patient was given clear instructions to go to Emergency Department or return to medical center if symptoms don't improve, worsen, or new problems develop.The patient verbalized understanding.   Orders Placed This Encounter  Procedures   DG Chest 2 View   DG Abd 2 Views   CMP14+EGFR   CBC     Requested Prescriptions    Signed Prescriptions Disp Refills   predniSONE (DELTASONE) 10 MG tablet 21 tablet 0    Sig: Take 6 tablets (60 mg total) by mouth daily with breakfast for 1 day, THEN 5 tablets (50 mg total) daily with breakfast for 1 day, THEN 4 tablets (40 mg total) daily with breakfast for 1 day, THEN 3 tablets (30 mg total) daily with breakfast for 1 day, THEN 2 tablets (20 mg total) daily with breakfast for 1 day, THEN 1 tablet (10 mg total) daily with breakfast for 1 day.    Return in about 1 week (around 07/16/2023) for Follow-Up or next available with Georganna Skeans, MD.  Rema Fendt, NP

## 2023-07-09 NOTE — Progress Notes (Signed)
Pain in left side under ribs.   Patient states no other concerns to speak about.

## 2023-07-10 LAB — CMP14+EGFR
ALT: 16 [IU]/L (ref 0–44)
AST: 22 [IU]/L (ref 0–40)
Albumin: 4.2 g/dL (ref 3.9–4.9)
Alkaline Phosphatase: 116 [IU]/L (ref 44–121)
BUN/Creatinine Ratio: 7 — ABNORMAL LOW (ref 10–24)
BUN: 7 mg/dL — ABNORMAL LOW (ref 8–27)
Bilirubin Total: 0.7 mg/dL (ref 0.0–1.2)
CO2: 24 mmol/L (ref 20–29)
Calcium: 9.5 mg/dL (ref 8.6–10.2)
Chloride: 99 mmol/L (ref 96–106)
Creatinine, Ser: 0.96 mg/dL (ref 0.76–1.27)
Globulin, Total: 2.9 g/dL (ref 1.5–4.5)
Glucose: 84 mg/dL (ref 70–99)
Potassium: 4.5 mmol/L (ref 3.5–5.2)
Sodium: 138 mmol/L (ref 134–144)
Total Protein: 7.1 g/dL (ref 6.0–8.5)
eGFR: 89 mL/min/{1.73_m2}

## 2023-07-10 LAB — CBC
Hematocrit: 45 % (ref 37.5–51.0)
Hemoglobin: 14.7 g/dL (ref 13.0–17.7)
MCH: 31.7 pg (ref 26.6–33.0)
MCHC: 32.7 g/dL (ref 31.5–35.7)
MCV: 97 fL (ref 79–97)
Platelets: 222 10*3/uL (ref 150–450)
RBC: 4.63 x10E6/uL (ref 4.14–5.80)
RDW: 13.4 % (ref 11.6–15.4)
WBC: 5.4 10*3/uL (ref 3.4–10.8)

## 2023-07-20 ENCOUNTER — Ambulatory Visit: Payer: Self-pay

## 2023-07-20 NOTE — Telephone Encounter (Signed)
Pt has an appt scheduled on 10/09 at 1:20 with provider

## 2023-07-20 NOTE — Telephone Encounter (Signed)
Called pt to discuss alternate care options since [facility] isn't available today. LVMTCB.

## 2023-07-20 NOTE — Telephone Encounter (Addendum)
Called pt back as [facility] is not available today. Call went to voice mail. Requested call back  to discuss alternate care.   Chief Complaint: Swelling below right eye - size of a golf ball. Symptoms: Swollen, slightly warm Frequency: Saturday morning Pertinent Negatives: Patient denies pain, itching Disposition: [] ED /[] Urgent Care (no appt availability in office) / [] Appointment(In office/virtual)/ []  [place] [facility]/ [] Home Care/ [] Refused Recommended Disposition /[x] [place] [facility]/ []  Follow-up with PCP Additional Notes: Pt states he woke up Saturday morning with swelling below right eye - the size of a golf ball.  NO appts in office pt will go to [facility] today.   Reason for Disposition  MODERATE-SEVERE eyelid swelling on one side  (Exception: Due to a mosquito bite.)  Answer Assessment - Initial Assessment Questions 1. ONSET: "When did the swelling start?" (e.g., minutes, hours, days)     Saturday morning 2. LOCATION: "What part of the eyelids is swollen?"     Below eye 3. SEVERITY: "How swollen is it?"     Golf ball 4. ITCHING: "Is there any itching?" If Yes, ask: "How much?"   (Scale 1-10; mild, moderate or severe)     no 5. PAIN: "Is the swelling painful to touch?" If Yes, ask: "How painful is it?"   (Scale 1-10; mild, moderate or severe)     no 6. FEVER: "Do you have a fever?" If Yes, ask: "What is it, how was it measured, and when did it start?"      no 7. CAUSE: "What do you think is causing the swelling?"     unknown 8. RECURRENT SYMPTOM: "Have you had eyelid swelling before?" If Yes, ask: "When was the last time?" "What happened that time?"     no 9. OTHER SYMPTOMS: "Do you have any other symptoms?" (e.g., blurred vision, eye discharge, rash, runny nose)     Eye will water  Protocols used: Eye - Swelling-A-AH

## 2023-07-21 ENCOUNTER — Encounter: Payer: Self-pay | Admitting: Family Medicine

## 2023-07-21 ENCOUNTER — Ambulatory Visit: Payer: 59 | Admitting: Family Medicine

## 2023-07-21 VITALS — BP 126/79 | HR 62 | Temp 98.2°F | Resp 16 | Ht 67.0 in | Wt 204.4 lb

## 2023-07-21 DIAGNOSIS — L0201 Cutaneous abscess of face: Secondary | ICD-10-CM

## 2023-07-21 MED ORDER — DOXYCYCLINE HYCLATE 100 MG PO TABS: 100.0000 mg | ORAL_TABLET | Freq: Two times a day (BID) | ORAL | 0 refills | Status: DC

## 2023-07-23 ENCOUNTER — Encounter: Payer: Self-pay | Admitting: Family Medicine

## 2023-07-23 NOTE — Progress Notes (Signed)
Established Patient Office Visit  Subjective    Patient ID: Devin Guzman, male    DOB: [birth year]  Age: 62 y.o. MRN: [id number]  CC:  Chief Complaint  Patient presents with   Follow-up    Right check swollen with drainage    HPI Devin Guzman presents with complaint of swelling of right side of face with drainage. Denies known trauma or injury. Denies fever/chills.   Outpatient Encounter Medications as of 07/21/2023  Medication Sig   doxycycline (VIBRA-TABS) 100 MG tablet Take 1 tablet (100 mg total) by mouth 2 (two) times daily.   atorvastatin (LIPITOR) 40 MG tablet TAKE 1 TABLET BY MOUTH EVERY DAY   No facility-administered encounter medications on file as of 07/21/2023.    Past Medical History:  Diagnosis Date   Peripheral vascular disease [facility])     Past Surgical History:  Procedure Laterality Date   ABDOMINAL AORTOGRAM W/LOWER EXTREMITY N/A 10/10/2021   Procedure: ABDOMINAL AORTOGRAM W/LOWER EXTREMITY;  Surgeon: Leonie Douglas, MD;  Location: [facility];  Service: Cardiovascular;  Laterality: N/A;   PERIPHERAL VASCULAR INTERVENTION Bilateral 10/10/2021   Procedure: PERIPHERAL VASCULAR INTERVENTION;  Surgeon: Leonie Douglas, MD;  Location: [facility];  Service: Cardiovascular;  Laterality: Bilateral;    Family History  Problem Relation Age of Onset   Hypertension Mother     Social History   Socioeconomic History   Marital status: Single    Spouse name: Not on file   Number of children: Not on file   Years of education: Not on file   Highest education level: Not on file  Occupational History   Not on file  Tobacco Use   Smoking status: Every Day    Current packs/day: 0.25    Types: Cigarettes   Smokeless tobacco: Never  Vaping Use   Vaping status: Never Used  Substance and Sexual Activity   Alcohol use: Yes    Alcohol/week: 3.0 standard drinks of alcohol    Types: 3 Cans of beer per week   Drug use: No   Sexual activity: Not on  file  Other Topics Concern   Not on file  Social History Narrative   Not on file   Social Determinants of Health   Financial Resource Strain: Low Risk  (07/21/2023)   Overall Financial Resource Strain (CARDIA)    Difficulty of Paying Living Expenses: Not hard at all  Food Insecurity: No Food Insecurity (07/21/2023)   Hunger Vital Sign    Worried About Running Out of Food in the Last Year: Never true    Ran Out of Food in the Last Year: Never true  Transportation Needs: No Transportation Needs (07/21/2023)   PRAPARE - Administrator, Civil Service (Medical): No    Lack of Transportation (Non-Medical): No  Physical Activity: Insufficiently Active (07/21/2023)   Exercise Vital Sign    Days of Exercise per Week: 5 days    Minutes of Exercise per Session: 20 min  Stress: No Stress Concern Present (07/21/2023)   [employer] of Occupational Health - Occupational Stress Questionnaire    Feeling of Stress : Not at all  Social Connections: Moderately Isolated (07/21/2023)   Social Connection and Isolation Panel [NHANES]    Frequency of Communication with Friends and Family: Three times a week    Frequency of Social Gatherings with Friends and Family: More than three times a week    Attends Religious Services: More than 4 times per year  Active Member of Clubs or Organizations: No    Attends Banker Meetings: Never    Marital Status: Never married  Intimate Partner Violence: Not At Risk (07/21/2023)   Humiliation, Afraid, Rape, and Kick questionnaire    Fear of Current or Ex-Partner: No    Emotionally Abused: No    Physically Abused: No    Sexually Abused: No    Review of Systems  All other systems reviewed and are negative.       Objective    BP 126/79 (BP Location: Right Arm, Patient Position: Sitting, Cuff Size: Large)   Pulse 62   Temp 98.2 F (36.8 C) (Oral)   Resp 16   Ht 5\' 7"  (1.702 m)   Wt 204 lb 6.4 oz (92.7 kg)   SpO2 98%   BMI 32.01  kg/m   Physical Exam Vitals and nursing note reviewed.  Constitutional:      General: He is not in acute distress. HENT:     Head:     Jaw: Swelling (right side with purulent drainage) present.  Cardiovascular:     Rate and Rhythm: Normal rate and regular rhythm.  Pulmonary:     Effort: Pulmonary effort is normal.     Breath sounds: Normal breath sounds.  Neurological:     General: No focal deficit present.     Mental Status: He is alert and oriented to person, place, and time.         Assessment & Plan:   Abscess of face -     WOUND CULTURE  Other orders -     Doxycycline Hyclate; Take 1 tablet (100 mg total) by mouth 2 (two) times daily.  Dispense: 20 tablet; Refill: 0     No follow-ups on file.   Devin Raymond, MD

## 2023-07-25 LAB — WOUND CULTURE

## 2023-07-30 ENCOUNTER — Other Ambulatory Visit: Payer: Self-pay | Admitting: Family

## 2023-07-30 DIAGNOSIS — I7090 Unspecified atherosclerosis: Secondary | ICD-10-CM

## 2023-07-30 DIAGNOSIS — I517 Cardiomegaly: Secondary | ICD-10-CM

## 2023-07-30 DIAGNOSIS — I700 Atherosclerosis of aorta: Secondary | ICD-10-CM

## 2023-08-04 ENCOUNTER — Telehealth: Payer: Self-pay | Admitting: *Deleted

## 2023-08-04 NOTE — Telephone Encounter (Signed)
Pt given lab results per notes of Dr. Andrey Campanile from 07/30/23 on 08/04/23. Pt verbalized understanding. Patient on antibiotics. No further management recommended at this time if patient is improving  Patient reports he is improving and will continue to take antibiotics until gone.

## 2023-09-21 ENCOUNTER — Telehealth: Payer: Self-pay | Admitting: Family Medicine

## 2023-09-21 NOTE — Telephone Encounter (Signed)
Copied from CRM [id number]. Topic: General - Inquiry >> Sep 21, 2023  1:06 PM Lennox Pippins wrote: Patient has called stating he had a missed call. No documentation of anyone calling patient. Patient states he did not check his VM, advised patient to check VM.    Please advise if patient needs a return phone call.

## 2023-09-21 NOTE — Telephone Encounter (Signed)
I looked back in the chart and no one from this office has called him since 07/20/2023

## 2024-03-16 ENCOUNTER — Emergency Department (HOSPITAL_COMMUNITY): Payer: Self-pay

## 2024-03-16 ENCOUNTER — Observation Stay (HOSPITAL_COMMUNITY)
Admission: EM | Admit: 2024-03-16 | Discharge: 2024-03-18 | Disposition: A | Discharge status: Home / Self Care | Payer: Self-pay | Attending: Emergency Medicine | Admitting: Emergency Medicine

## 2024-03-16 ENCOUNTER — Other Ambulatory Visit: Payer: Self-pay

## 2024-03-16 ENCOUNTER — Encounter (HOSPITAL_COMMUNITY): Payer: Self-pay

## 2024-03-16 DIAGNOSIS — F10929 Alcohol use, unspecified with intoxication, unspecified: Secondary | ICD-10-CM

## 2024-03-16 DIAGNOSIS — Z7982 Long term (current) use of aspirin: Secondary | ICD-10-CM | POA: Insufficient documentation

## 2024-03-16 DIAGNOSIS — W010XXA Fall on same level from slipping, tripping and stumbling without subsequent striking against object, initial encounter: Secondary | ICD-10-CM | POA: Insufficient documentation

## 2024-03-16 DIAGNOSIS — I739 Peripheral vascular disease, unspecified: Secondary | ICD-10-CM | POA: Insufficient documentation

## 2024-03-16 DIAGNOSIS — Z8673 Personal history of transient ischemic attack (TIA), and cerebral infarction without residual deficits: Secondary | ICD-10-CM

## 2024-03-16 DIAGNOSIS — F17210 Nicotine dependence, cigarettes, uncomplicated: Secondary | ICD-10-CM | POA: Insufficient documentation

## 2024-03-16 DIAGNOSIS — S065XAA Traumatic subdural hemorrhage with loss of consciousness status unknown, initial encounter: Principal | ICD-10-CM | POA: Diagnosis present

## 2024-03-16 DIAGNOSIS — Z79899 Other long term (current) drug therapy: Secondary | ICD-10-CM | POA: Insufficient documentation

## 2024-03-16 DIAGNOSIS — I6529 Occlusion and stenosis of unspecified carotid artery: Secondary | ICD-10-CM | POA: Diagnosis present

## 2024-03-16 DIAGNOSIS — S00211A Abrasion of right eyelid and periocular area, initial encounter: Secondary | ICD-10-CM | POA: Insufficient documentation

## 2024-03-16 DIAGNOSIS — F1090 Alcohol use, unspecified, uncomplicated: Secondary | ICD-10-CM | POA: Insufficient documentation

## 2024-03-16 LAB — CBC WITH DIFFERENTIAL/PLATELET
Abs Immature Granulocytes: 0.03 10*3/uL (ref 0.00–0.07)
Basophils Absolute: 0.1 10*3/uL (ref 0.0–0.1)
Basophils Relative: 1 %
Eosinophils Absolute: 0.3 10*3/uL (ref 0.0–0.5)
Eosinophils Relative: 5 %
HCT: 45.5 % (ref 39.0–52.0)
Hemoglobin: 15.3 g/dL (ref 13.0–17.0)
Immature Granulocytes: 0 %
Lymphocytes Relative: 55 %
Lymphs Abs: 3.7 10*3/uL (ref 0.7–4.0)
MCH: 31.4 pg (ref 26.0–34.0)
MCHC: 33.6 g/dL (ref 30.0–36.0)
MCV: 93.4 fL (ref 80.0–100.0)
Monocytes Absolute: 0.5 10*3/uL (ref 0.1–1.0)
Monocytes Relative: 7 %
Neutro Abs: 2.2 10*3/uL (ref 1.7–7.7)
Neutrophils Relative %: 32 %
Platelets: 204 10*3/uL (ref 150–400)
RBC: 4.87 MIL/uL (ref 4.22–5.81)
RDW: 15.3 % (ref 11.5–15.5)
WBC: 6.7 10*3/uL (ref 4.0–10.5)
nRBC: 0 % (ref 0.0–0.2)

## 2024-03-16 LAB — COMPREHENSIVE METABOLIC PANEL WITH GFR
ALT: 20 U/L (ref 0–44)
AST: 21 U/L (ref 15–41)
Albumin: 3.6 g/dL (ref 3.5–5.0)
Alkaline Phosphatase: 98 U/L (ref 38–126)
Anion gap: 7 (ref 5–15)
BUN: 6 mg/dL — ABNORMAL LOW (ref 8–23)
CO2: 26 mmol/L (ref 22–32)
Calcium: 8.9 mg/dL (ref 8.9–10.3)
Chloride: 103 mmol/L (ref 98–111)
Creatinine, Ser: 1.08 mg/dL (ref 0.61–1.24)
GFR, Estimated: >60 mL/min (ref >60)
Glucose, Bld: 112 mg/dL — ABNORMAL HIGH (ref 70–99)
Potassium: 4.2 mmol/L (ref 3.5–5.1)
Sodium: 136 mmol/L (ref 135–145)
Total Bilirubin: 0.4 mg/dL (ref 0.0–1.2)
Total Protein: 7.4 g/dL (ref 6.5–8.1)

## 2024-03-16 LAB — RAPID URINE DRUG SCREEN, HOSP PERFORMED
Amphetamines: NOT DETECTED
Barbiturates: NOT DETECTED
Benzodiazepines: NOT DETECTED
Cocaine: NOT DETECTED
Opiates: NOT DETECTED
Tetrahydrocannabinol: NOT DETECTED

## 2024-03-16 LAB — URINALYSIS, ROUTINE W REFLEX MICROSCOPIC
Bacteria, UA: NONE SEEN
Bilirubin Urine: NEGATIVE
Glucose, UA: NEGATIVE mg/dL
Ketones, ur: NEGATIVE mg/dL
Leukocytes,Ua: NEGATIVE
Nitrite: NEGATIVE
Protein, ur: NEGATIVE mg/dL
Specific Gravity, Urine: 1.001 — ABNORMAL LOW (ref 1.005–1.030)
pH: 5 (ref 5.0–8.0)

## 2024-03-16 LAB — PROTIME-INR
INR: 1.1 (ref 0.8–1.2)
Prothrombin Time: 14 s (ref 11.4–15.2)

## 2024-03-16 LAB — LIPASE, BLOOD: Lipase: 41 U/L (ref 11–51)

## 2024-03-16 LAB — HIV ANTIBODY (ROUTINE TESTING W REFLEX): HIV Screen 4th Generation wRfx: NONREACTIVE

## 2024-03-16 LAB — ETHANOL: Alcohol, Ethyl (B): 225 mg/dL — ABNORMAL HIGH (ref <15)

## 2024-03-16 MED ORDER — SODIUM CHLORIDE 0.9% FLUSH
3.0000 mL | Freq: Two times a day (BID) | INTRAVENOUS | Status: DC
  Administered 2024-03-16 – 2024-03-18 (×5): 3 mL via INTRAVENOUS

## 2024-03-16 MED ORDER — THIAMINE HCL 100 MG/ML IJ SOLN: 100.0000 mg | Freq: Every day | INTRAMUSCULAR | Status: DC

## 2024-03-16 MED ORDER — ADULT MULTIVITAMIN W/MINERALS CH
1.0000 | ORAL_TABLET | Freq: Every day | ORAL | Status: DC
  Administered 2024-03-17 – 2024-03-18 (×2): 1 via ORAL
  Filled 2024-03-16 (×2): qty 1

## 2024-03-16 MED ORDER — ACETAMINOPHEN 325 MG PO TABS
650.0000 mg | ORAL_TABLET | Freq: Four times a day (QID) | ORAL | Status: DC | PRN
  Administered 2024-03-17 (×3): 650 mg via ORAL
  Filled 2024-03-16 (×3): qty 2

## 2024-03-16 MED ORDER — POLYETHYLENE GLYCOL 3350 17 G PO PACK: 17.0000 g | PACK | Freq: Every day | ORAL | Status: DC | PRN

## 2024-03-16 MED ORDER — THIAMINE MONONITRATE 100 MG PO TABS
100.0000 mg | ORAL_TABLET | Freq: Every day | ORAL | Status: DC
  Administered 2024-03-17 – 2024-03-18 (×2): 100 mg via ORAL
  Filled 2024-03-16 (×2): qty 1

## 2024-03-16 MED ORDER — ATORVASTATIN CALCIUM 40 MG PO TABS
40.0000 mg | ORAL_TABLET | Freq: Every day | ORAL | Status: DC
  Administered 2024-03-17 – 2024-03-18 (×2): 40 mg via ORAL
  Filled 2024-03-16 (×2): qty 1

## 2024-03-16 MED ORDER — ACETAMINOPHEN 650 MG RE SUPP: 650.0000 mg | Freq: Four times a day (QID) | RECTAL | Status: DC | PRN

## 2024-03-16 MED ORDER — FOLIC ACID 1 MG PO TABS
1.0000 mg | ORAL_TABLET | Freq: Every day | ORAL | Status: DC
  Administered 2024-03-17 – 2024-03-18 (×2): 1 mg via ORAL
  Filled 2024-03-16 (×2): qty 1

## 2024-03-16 NOTE — Progress Notes (Signed)
 Patient ID: Devin Guzman, male   DOB: Mar 31, 1961, 63 y.o.   MRN: [id number] BP 128/83   Pulse 63   Temp 98.1 F (36.7 C) (Oral)   Resp 16   Ht 5\' 7"  (1.702 m)   Wt 92.5 kg   SpO2 98%   BMI 31.95 kg/m  Head CT reviewed. The right side subdural is very small, causing no mass effect, nor ventricular effacement. Basal cisterns widely patent. No need for repeat scan unless neurological exam changes.

## 2024-03-16 NOTE — Progress Notes (Signed)
 CSW added substance abuse resources to patient's AVS.  Edwin Dada, MSW, LCSW Transitions of Care  Clinical Social Worker II [phone]

## 2024-03-16 NOTE — ED Notes (Signed)
 This paramedic walked into patient's room to find him fully dressed in urine stained clothes sitting in the chair with no monitoring on. Pt states he thought he was leaving. Pt stretcher was also soaked in urine. Bedding changed. Pt put back in  clean gown. Urinal given. Pt advised he needs to keep monitoring on and stay in bed.

## 2024-03-16 NOTE — ED Notes (Signed)
 Girlfriend Devin Guzman [phone] would like an update asap

## 2024-03-16 NOTE — H&P (Signed)
 History and Physical   Devin Guzman WUJ:[id number] DOB: July 08, 1961 DOA: 03/16/2024  PCP: [name], MD   Patient coming from: Home / Street  Chief Complaint: Fall  HPI: Devin Guzman is a 63 y.o. male with medical history significant of stroke, carotid artery disease, peripheral vascular disease presenting after a fall.  Patient was drinking at a friend's house last night and on his walk home he fell.  Patient does not have much memory of the events and is unsure what happened likely accommodation of his alcohol use and his fall where he struck his head.  Was found this morning laying by the side of the road with an abrasion to his right forehead and left hand.  Currently denies fevers, chills, chest pain, shortness of breath, abdominal pain, constipation, diarrhea, nausea, vomiting.  -------------------------------------------  Spoke with girlfriend who reports he slipped leaving her house and walking to his car. She thinks he slipped in the grass on the hill. She says he had been drinking overnight.  ED Course: Vital signs in the ED notable for blood pressure 140 systolic.  Lab workup included CMP with glucose 112.  CBC within normal limits.  PT and INR pending.  Lipase normal.  UDS and urinalysis pending.  Ethanol level 225.  CT head showed small subdural hematoma of the right lateral region with no mass effect or midline shift.  CT C-spine showed no acute normality and degenerative disc disease.  Right shoulder x-ray showed no acute [place] and degenerative changes.  Neurosurgery consulted in the ED and will see the patient in the recommendations.  Review of Systems: As per HPI otherwise all other systems reviewed and are negative.  Past Medical History:  Diagnosis Date   Peripheral vascular disease [facility])     Past Surgical History:  Procedure Laterality Date   ABDOMINAL AORTOGRAM W/LOWER EXTREMITY N/A 10/10/2021   Procedure: ABDOMINAL AORTOGRAM W/LOWER EXTREMITY;   Surgeon: Carlene Che, MD;  Location: [facility] INVASIVE CV LAB;  Service: Cardiovascular;  Laterality: N/A;   PERIPHERAL VASCULAR INTERVENTION Bilateral 10/10/2021   Procedure: PERIPHERAL VASCULAR INTERVENTION;  Surgeon: Carlene Che, MD;  Location: [facility] INVASIVE CV LAB;  Service: Cardiovascular;  Laterality: Bilateral;    Social History  reports that he has been smoking cigarettes. He has never used smokeless tobacco. He reports current alcohol use of about 3.0 standard drinks of alcohol per week. He reports that he does not use drugs.  Allergies  Allergen Reactions   Codeine Other (See Comments)    Cause Shaking    Family History  Problem Relation Age of Onset   Hypertension Mother   Reviewed on admission  Prior to Admission medications   Medication Sig Start Date End Date Taking? Authorizing Provider  atorvastatin  (LIPITOR) 40 MG tablet TAKE 1 TABLET BY MOUTH EVERY DAY 11/18/22   Adine Hoof, MD  doxycycline  (VIBRA -TABS) 100 MG tablet Take 1 tablet (100 mg total) by mouth 2 (two) times daily. 07/21/23   [name], MD    Physical Exam: Vitals:   03/16/24 1241 03/16/24 1242 03/16/24 1307 03/16/24 1313  BP:  (!) 148/81 (!) 149/91 (!) 149/91  Pulse:  67 62 60  Resp:  19 15   Temp:  98.3 F (36.8 C)    TempSrc:  Oral    SpO2:  99% 96%   Weight: 92.5 kg     Height: 5\' 7"  (1.702 m)       Physical Exam Constitutional:  General: He is not in acute distress.    Comments: Pleasant, mildly intoxicated appearing male with urine soaked pants  HENT:     Head:     Comments: Abrasion    Mouth/Throat:     Mouth: Mucous membranes are moist.     Pharynx: Oropharynx is clear.  Eyes:     Extraocular Movements: Extraocular movements intact.     Pupils: Pupils are equal, round, and reactive to light.  Cardiovascular:     Rate and Rhythm: Normal rate and regular rhythm.     Pulses: Normal pulses.     Heart sounds: Normal heart sounds.  Pulmonary:     Effort:  Pulmonary effort is normal. No respiratory distress.     Breath sounds: Normal breath sounds.  Abdominal:     General: Bowel sounds are normal. There is no distension.     Palpations: Abdomen is soft.     Tenderness: There is no abdominal tenderness.  Musculoskeletal:        General: No swelling or deformity.  Skin:    General: Skin is warm and dry.  Neurological:     General: No focal deficit present.     Mental Status: Mental status is at baseline.    Labs on Admission: I have personally reviewed following labs and imaging studies  CBC: Recent Labs  Lab 03/16/24 1307  WBC 6.7  NEUTROABS 2.2  HGB 15.3  HCT 45.5  MCV 93.4  PLT 204    Basic Metabolic Panel: Recent Labs  Lab 03/16/24 1307  NA 136  K 4.2  CL 103  CO2 26  GLUCOSE 112*  BUN 6*  CREATININE 1.08  CALCIUM  8.9    GFR: Estimated Creatinine Clearance: 76 mL/min (by C-G formula based on SCr of 1.08 mg/dL).  Liver Function Tests: Recent Labs  Lab 03/16/24 1307  AST 21  ALT 20  ALKPHOS 98  BILITOT 0.4  PROT 7.4  ALBUMIN 3.6    Urine analysis:    Component Value Date/Time   COLORURINE COLORLESS (A) 03/16/2024 1307   APPEARANCEUR CLEAR 03/16/2024 1307   LABSPEC 1.001 (L) 03/16/2024 1307   PHURINE 5.0 03/16/2024 1307   GLUCOSEU NEGATIVE 03/16/2024 1307   HGBUR SMALL (A) 03/16/2024 1307   BILIRUBINUR NEGATIVE 03/16/2024 1307   KETONESUR NEGATIVE 03/16/2024 1307   PROTEINUR NEGATIVE 03/16/2024 1307   UROBILINOGEN 1.0 03/03/2010 1756   NITRITE NEGATIVE 03/16/2024 1307   LEUKOCYTESUR NEGATIVE 03/16/2024 1307    Radiological Exams on Admission: CT Head Wo Contrast Result Date: 03/16/2024 CLINICAL DATA:  Fall. EXAM: CT HEAD WITHOUT CONTRAST TECHNIQUE: Contiguous axial images were obtained from the base of the skull through the vertex without intravenous contrast. RADIATION DOSE REDUCTION: This exam was performed according to the departmental dose-optimization program which includes automated  exposure control, adjustment of the mA and/or kV according to patient size and/or use of iterative reconstruction technique. COMPARISON:  09/19/2022 FINDINGS: Brain: Small subdural hematoma noted over the right cerebral hemisphere measuring 3 mm in thickness. Similarly sized subdural hematoma layering posteriorly along the right side of the falx. No mass effect or midline shift. No intraparenchymal hemorrhage. There is atrophy and chronic small vessel disease changes. Vascular: No hyperdense vessel or unexpected calcification. Skull: No acute calvarial abnormality. Sinuses/Orbits: No acute findings. Other: None IMPRESSION: Small subdural hematoma laterally over the right cerebral hemisphere and along the right posterior falx. No mass effect or midline shift. These results were called by telephone at the time of interpretation on 03/16/2024  at 2:17 pm to provider Debbra Fairy , who verbally acknowledged these results. Electronically Signed   By: Janeece Mechanic M.D.   On: 03/16/2024 14:20   DG Shoulder Right Result Date: 03/16/2024 CLINICAL DATA:  Degenerative changes in the right [place] joint. No acute bony abnormality. EXAM: RIGHT SHOULDER - 2+ VIEW COMPARISON:  None Available. FINDINGS: Degenerative changes in the [facility] joint with joint space narrowing and spurring. Glenohumeral joint is maintained. No acute bony abnormality. Specifically, no fracture, subluxation, or dislocation. Soft tissues are intact. IMPRESSION: Changes in the right [facility] joint Electronically Signed   By: Janeece Mechanic M.D.   On: 03/16/2024 14:15   CT Cervical Spine Wo Contrast Result Date: 03/16/2024 CLINICAL DATA:  Polytrauma, blunt.  Fall. EXAM: CT CERVICAL SPINE WITHOUT CONTRAST TECHNIQUE: Multidetector CT imaging of the cervical spine was performed without intravenous contrast. Multiplanar CT image reconstructions were also generated. RADIATION DOSE REDUCTION: This exam was performed according to the departmental dose-optimization program which  includes automated exposure control, adjustment of the mA and/or kV according to patient size and/or use of iterative reconstruction technique. COMPARISON:  09/19/2022 FINDINGS: Alignment: Normal Skull base and vertebrae: No acute fracture. No primary bone lesion or focal pathologic process. Soft tissues and spinal canal: No prevertebral fluid or swelling. No visible canal hematoma. Disc levels: Diffuse degenerative disc disease with disc space narrowing and anterior spurring. Upper chest: No acute findings Other: None IMPRESSION: Degenerative disc disease.  No acute bony abnormality. Electronically Signed   By: Janeece Mechanic M.D.   On: 03/16/2024 14:15   EKG: Independently reviewed.  Sinus rhythm at 65 bpm.  Nonspecific T wave changes.  Baseline artifact in multiple leads.  Assessment/Plan Principal Problem:   Subdural hematoma (HCC) Active Problems:   Carotid artery stenosis   History of CVA (cerebrovascular accident)   PVD (peripheral vascular disease) (HCC)   Fall Intoxication Subdural hematoma > Patient presenting with subdural hematoma which was discovered on CT head after he was found on the side of road with an abrasion on his head. > Patient's memory around events is fuzzy but appears he was out drinking at a friend's house and fell on his walker home. > Ethanol level 225 in ED.  Lab work otherwise benign with stable hemoglobin.  PT and INR pending.  Small subdural hematoma on CT head without mass effect or midline shift.  CT C-spine with no acute abnormality. > Neurosurgery consulted in the ED and will see the patient.  Plan for admission and repeat imaging in the morning most likely. - Monitor on telemetry overnight - Appreciate neurosurgery recommendations and assistance - Anticipate repeat CT head in the morning - Neurochecks - Supportive care  ETOH use > Report drinking 1-2 drinks per night. Suspect intake maybe more than this. - CIWA without Ativan for now - Folate,  Thiamine, MultiVitamin  History of CVA History of carotid artery disease History of PVD - Continue home atorvastatin  - Hold ASA  DVT prophylaxis: SCDs Code Status:   Full Family Communication:  Girlfriend, Cornelius Dill, updated by phone with patient's permission at [phone], would like to be updated with any significant developments.    Disposition Plan:   Patient is from:  Home  Anticipated DC to:  Home  Anticipated DC date:  1 to 3 days  Anticipated DC barriers: None  Consults called:  Neurosurgery Admission status:  Observation, telemetry  Severity of Illness: The appropriate patient status for this patient is OBSERVATION. Observation status is judged to be reasonable  and necessary in order to provide the required intensity of service to ensure the patient's safety. The patient's presenting symptoms, physical exam findings, and initial radiographic and laboratory data in the context of their medical condition is felt to place them at decreased risk for further clinical deterioration. Furthermore, it is anticipated that the patient will be medically stable for discharge from the hospital within 2 midnights of admission.    Johnetta Nab MD [facility]  How to contact the [facility] Attending or Consulting provider 7A - 7P or covering provider during after hours 7P -7A, for this patient?   Check the care team in [facility] and look for a) attending/consulting [facility] provider listed and b) the [facility] team listed Log into [email or web address] and use [place]'s universal password to access. If you do not have the password, please contact the hospital operator. Locate the [facility] provider you are looking for under [facility] and page to a number that you can be directly reached. If you still have difficulty reaching the provider, please page the [facility] (Director on Call) for the Hospitalists listed on amion for assistance.  03/16/2024, 3:59 PM

## 2024-03-16 NOTE — Discharge Instructions (Signed)

## 2024-03-16 NOTE — ED Triage Notes (Signed)
 Patient BIB EMS after patient left friends house after drinking all night and was walking home and fell and laid in the street.  Abrasion to right forehead and hand.  Smells of alcohol.  No thinners.  Unknown LOC.  Patient is alert.  C collar in place.

## 2024-03-16 NOTE — ED Provider Notes (Addendum)
 [place] [facility] AT [facility] Provider Note   CSN: [id number] Arrival date & time: 03/16/24  1234     History  Chief Complaint  Patient presents with   Fall   Alcohol Intoxication    Devin Guzman is a 63 y.o. male.  The history is provided by the patient and medical records. No language interpreter was used.  Fall  Alcohol Intoxication     63 year old male history of alcohol abuse, carotid artery stenosis, prior stroke, tobacco use, brought here via EMS for evaluation of syncope.  Per EMS note, patient apparently left his friend's house last night after drinking all night and as he was walking home he apparently fell and was found laying on the street.  They noted that he had abrasion to his right forehead as well as his left hand and he was smells of alcohol.  Unsure if patient has any loss of consciousness but a c-collar was placed.  Patient states he was at his friend's house last night and did drink 1 alcoholic beverage.  He then remember finding himself waking up in the ER and unsure what has happened.  He believes he may have passed out due to not eating much last night.  States he only ate some bacon yesterday morning but did not have much of an appetite.  At this time he is without any complaint.  He denies have any significant headache, neck pain, chest pain, trouble breathing, abdominal pain, back pain, numbness weakness having any urinary symptoms.  Last tetanus was several years ago.  Patient admits to drinking alcohol regular basis and states he normally drinks 1-2 alcoholic beverage per day.  He denies any recreational drug use.  Home Medications Prior to Admission medications   Medication Sig Start Date End Date Taking? Authorizing Provider  atorvastatin  (LIPITOR) 40 MG tablet TAKE 1 TABLET BY MOUTH EVERY DAY 11/18/22   Adine Hoof, MD  doxycycline  (VIBRA -TABS) 100 MG tablet Take 1 tablet (100 mg total) by mouth 2 (two) times daily.  07/21/23   [name], MD      Allergies    Codeine    Review of Systems   Review of Systems  All other systems reviewed and are negative.   Physical Exam Updated Vital Signs BP (!) 148/81 (BP Location: Right Arm)   Pulse 67   Temp 98.3 F (36.8 C) (Oral)   Resp 19   Ht 5\' 7"  (1.702 m)   Wt 92.5 kg   SpO2 99%   BMI 31.95 kg/m  Physical Exam Constitutional:      General: He is not in acute distress.    Appearance: He is well-developed.  HENT:     Head:     Comments: Abrasion noted to right eyebrow laterally with minimal tenderness to palpation.  No significant midface tenderness.  No Battle sign or raccoon's eyes. Eyes:     Extraocular Movements: Extraocular movements intact.     Conjunctiva/sclera: Conjunctivae normal.     Pupils: Pupils are equal, round, and reactive to light.  Cardiovascular:     Rate and Rhythm: Normal rate and regular rhythm.     Pulses: Normal pulses.     Heart sounds: Normal heart sounds.  Pulmonary:     Effort: Pulmonary effort is normal.     Breath sounds: Normal breath sounds.  Abdominal:     Palpations: Abdomen is soft.     Tenderness: There is no abdominal tenderness.  Musculoskeletal:  Cervical back: Normal range of motion and neck supple.  Skin:    Findings: No rash.  Neurological:     Mental Status: He is alert.     ED Results / Procedures / Treatments   Labs (all labs ordered are listed, but only abnormal results are displayed) Labs Reviewed  COMPREHENSIVE METABOLIC PANEL WITH GFR - Abnormal; Notable for the following components:      Result Value   Glucose, Bld 112 (*)    BUN 6 (*)    All other components within normal limits  ETHANOL - Abnormal; Notable for the following components:   Alcohol, Ethyl (B) 225 (*)    All other components within normal limits  LIPASE, BLOOD  CBC WITH DIFFERENTIAL/PLATELET  RAPID URINE DRUG SCREEN, HOSP PERFORMED  URINALYSIS, ROUTINE W REFLEX MICROSCOPIC  PROTIME-INR  HIV  ANTIBODY (ROUTINE TESTING W REFLEX)    EKG EKG Interpretation Date/Time:  Thursday March 16 2024 12:41:45 EDT Ventricular Rate:  65 PR Interval:  182 QRS Duration:  108 QT Interval:  428 QTC Calculation: 445 R Axis:   86  Text Interpretation: Sinus rhythm Inferior infarct, age indeterminate Probable anterolateral infarct, old Confirmed by Auston Blush [phone]) on 03/16/2024 2:36:54 PM  Radiology CT Head Wo Contrast Result Date: 03/16/2024 CLINICAL DATA:  Fall. EXAM: CT HEAD WITHOUT CONTRAST TECHNIQUE: Contiguous axial images were obtained from the base of the skull through the vertex without intravenous contrast. RADIATION DOSE REDUCTION: This exam was performed according to the departmental dose-optimization program which includes automated exposure control, adjustment of the mA and/or kV according to patient size and/or use of iterative reconstruction technique. COMPARISON:  09/19/2022 FINDINGS: Brain: Small subdural hematoma noted over the right cerebral hemisphere measuring 3 mm in thickness. Similarly sized subdural hematoma layering posteriorly along the right side of the falx. No mass effect or midline shift. No intraparenchymal hemorrhage. There is atrophy and chronic small vessel disease changes. Vascular: No hyperdense vessel or unexpected calcification. Skull: No acute calvarial abnormality. Sinuses/Orbits: No acute findings. Other: None IMPRESSION: Small subdural hematoma laterally over the right cerebral hemisphere and along the right posterior falx. No mass effect or midline shift. These results were called by telephone at the time of interpretation on 03/16/2024 at 2:17 pm to provider [facility] , who verbally acknowledged these results. Electronically Signed   By: Janeece Mechanic M.D.   On: 03/16/2024 14:20   DG Shoulder Right Result Date: 03/16/2024 CLINICAL DATA:  Degenerative changes in the right [facility] joint. No acute bony abnormality. EXAM: RIGHT SHOULDER - 2+ VIEW COMPARISON:  None  Available. FINDINGS: Degenerative changes in the [facility] joint with joint space narrowing and spurring. Glenohumeral joint is maintained. No acute bony abnormality. Specifically, no fracture, subluxation, or dislocation. Soft tissues are intact. IMPRESSION: Changes in the right [facility] joint Electronically Signed   By: Janeece Mechanic M.D.   On: 03/16/2024 14:15   CT Cervical Spine Wo Contrast Result Date: 03/16/2024 CLINICAL DATA:  Polytrauma, blunt.  Fall. EXAM: CT CERVICAL SPINE WITHOUT CONTRAST TECHNIQUE: Multidetector CT imaging of the cervical spine was performed without intravenous contrast. Multiplanar CT image reconstructions were also generated. RADIATION DOSE REDUCTION: This exam was performed according to the departmental dose-optimization program which includes automated exposure control, adjustment of the mA and/or kV according to patient size and/or use of iterative reconstruction technique. COMPARISON:  09/19/2022 FINDINGS: Alignment: Normal Skull base and vertebrae: No acute fracture. No primary bone lesion or focal pathologic process. Soft tissues and spinal canal: No prevertebral  fluid or swelling. No visible canal hematoma. Disc levels: Diffuse degenerative disc disease with disc space narrowing and anterior spurring. Upper chest: No acute findings Other: None IMPRESSION: Degenerative disc disease.  No acute bony abnormality. Electronically Signed   By: Janeece Mechanic M.D.   On: 03/16/2024 14:15    Procedures .Critical Care  Performed by: Debbra Fairy, PA-C Authorized by: Debbra Fairy, PA-C   Critical care provider statement:    Critical care time (minutes):  30   Critical care was time spent personally by me on the following activities:  Development of treatment plan with patient or surrogate, discussions with consultants, evaluation of patient's response to treatment, examination of patient, ordering and review of laboratory studies, ordering and review of radiographic studies, ordering and  performing treatments and interventions, pulse oximetry, re-evaluation of patient's condition and review of old charts     Medications Ordered in ED Medications  sodium chloride  flush (NS) 0.9 % injection 3 mL (has no administration in time range)  acetaminophen  (TYLENOL ) tablet 650 mg (has no administration in time range)    Or  acetaminophen  (TYLENOL ) suppository 650 mg (has no administration in time range)  polyethylene glycol (MIRALAX / GLYCOLAX) packet 17 g (has no administration in time range)    ED Course/ Medical Decision Making/ A&P                                 Medical Decision Making Amount and/or Complexity of Data Reviewed Labs: ordered. Radiology: ordered.  Risk Decision regarding hospitalization.   BP (!) 149/91   Pulse 60   Temp 98.3 F (36.8 C) (Oral)   Resp 15   Ht 5\' 7"  (1.702 m)   Wt 92.5 kg   SpO2 96%   BMI 31.95 kg/m   51:65 PM  63 year old male history of alcohol abuse, carotid artery stenosis, prior stroke, tobacco use, brought here via EMS for evaluation of syncope.  Per EMS note, patient apparently left his friend's house last night after drinking all night and as he was walking home he apparently fell and was found laying on the street.  They noted that he had abrasion to his right forehead as well as his left hand and he was smells of alcohol.  Unsure if patient has any loss of consciousness but a c-collar was placed.  Patient states he was at his friend's house last night and did drink 1 alcoholic beverage.  He then remember finding himself waking up in the ER and unsure what has happened.  He believes he may have passed out due to not eating much last night.  States he only ate some bacon yesterday morning but did not have much of an appetite.  At this time he is without any complaint.  He denies have any significant headache, neck pain, chest pain, trouble breathing, abdominal pain, back pain, numbness weakness having any urinary symptoms.  Last  tetanus was several years ago.  Patient admits to drinking alcohol regular basis and states he normally drinks 1-2 alcoholic beverage per day.  He denies any recreational drug use.  On exam patient appears intoxicated but in no acute discomfort.  He has abrasion noted to his right forehead.  He has some small skin tear to his left hand.  He has some old bruising to his right shoulder with full shoulder range of motion.  He does not have any significant cervical spine tenderness and no  other injury noted.  Chest and abdomen nontender.  He is able to move all 4 extremities.  He is alert and oriented to times place and situation. He does appears intoxicated.  -Labs ordered, independently viewed and interpreted by me.  Labs remarkable for alcohol of 225 the remainder of his labs are reassuring -The patient was maintained on a cardiac monitor.  I personally viewed and interpreted the cardiac monitored which showed an underlying rhythm of: Sinus rhythm -Imaging independently viewed and interpreted by me and I agree with radiologist's interpretation.  Result remarkable for head CT scan demonstrated a small subdural hematoma laterally over the right cerebral hemisphere without any mass effect or midline shift.  Cervical spine x-ray CT and x-ray of right shoulder unremarkable -This patient presents to the ED for concern of syncope, this involves an extensive number of treatment options, and is a complaint that carries with it a high risk of complications and morbidity.  The differential diagnosis includes intracranial injury, alcohol intoxication, stroke, anemia, electrolyte derangement, hypoglycemia, delirium -Co morbidities that complicate the patient evaluation includes prior stroke, subdural hematoma, carotid artery stenosis -Treatment includes monitoring -Reevaluation of the patient after these medicines showed that the patient stayed the same -PCP office notes or outside notes reviewed -Discussion with  specialist [facility] Dr. Rufina Cough who will admit pt.  I have also consulted neurosurgeon Dr. Michale Age who will evaluate pt and offer recs -Escalation to admission/observation considered: patient agrees with admission.  Care discussed with family over the phone.          Final Clinical Impression(s) / ED Diagnoses Final diagnoses:  Subdural hematoma (HCC)  Alcoholic intoxication with complication [facility])    Rx / DC Orders ED Discharge Orders     None         Debbra Fairy, PA-C 03/16/24 1527    Debbra Fairy, PA-C 03/16/24 1527    Auston Blush, MD 03/16/24 1549

## 2024-03-17 ENCOUNTER — Observation Stay (HOSPITAL_COMMUNITY): Payer: Self-pay

## 2024-03-17 LAB — CBC
HCT: 44.7 % (ref 39.0–52.0)
Hemoglobin: 15.2 g/dL (ref 13.0–17.0)
MCH: 31.3 pg (ref 26.0–34.0)
MCHC: 34 g/dL (ref 30.0–36.0)
MCV: 92 fL (ref 80.0–100.0)
Platelets: 224 10*3/uL (ref 150–400)
RBC: 4.86 MIL/uL (ref 4.22–5.81)
RDW: 15.3 % (ref 11.5–15.5)
WBC: 8.3 10*3/uL (ref 4.0–10.5)
nRBC: 0 % (ref 0.0–0.2)

## 2024-03-17 LAB — MAGNESIUM: Magnesium: 2.1 mg/dL (ref 1.7–2.4)

## 2024-03-17 MED ORDER — GUAIFENESIN 100 MG/5ML PO LIQD: 5.0000 mL | ORAL | Status: DC | PRN

## 2024-03-17 MED ORDER — METOPROLOL TARTRATE 5 MG/5ML IV SOLN: 5.0000 mg | INTRAVENOUS | Status: DC | PRN

## 2024-03-17 MED ORDER — ONDANSETRON HCL 4 MG/2ML IJ SOLN: 4.0000 mg | Freq: Four times a day (QID) | INTRAMUSCULAR | Status: DC | PRN

## 2024-03-17 MED ORDER — SENNOSIDES-DOCUSATE SODIUM 8.6-50 MG PO TABS: 1.0000 | ORAL_TABLET | Freq: Every evening | ORAL | Status: DC | PRN

## 2024-03-17 MED ORDER — HYDRALAZINE HCL 20 MG/ML IJ SOLN: 10.0000 mg | INTRAMUSCULAR | Status: DC | PRN

## 2024-03-17 MED ORDER — GLUCAGON HCL RDNA (DIAGNOSTIC) 1 MG IJ SOLR: 1.0000 mg | INTRAMUSCULAR | Status: DC | PRN

## 2024-03-17 MED ORDER — IPRATROPIUM-ALBUTEROL 0.5-2.5 (3) MG/3ML IN SOLN: 3.0000 mL | RESPIRATORY_TRACT | Status: DC | PRN

## 2024-03-17 MED ORDER — HYDRALAZINE HCL 20 MG/ML IJ SOLN: 5.0000 mg | INTRAMUSCULAR | Status: DC | PRN

## 2024-03-17 NOTE — TOC Initial Note (Signed)
 Transition of Care [facility]) - Initial/Assessment Note    Patient Details  Name: Devin Guzman MRN: [id number] Date of Birth: July 01, 1961  Transition of Care [facility]) CM/SW Contact:    Jonathan Neighbor, RN Phone Number: 03/17/2024, 11:17 AM  Clinical Narrative:                  Pt lives with his mother and his sister checks on them.  Pt manages his own medication and denies any issues.  Pt drives. He has a friend that will provide transportation home at d/c.  Pt states he has health insurance but doesn't have his wallet for the information. CM will ask financial counseling to see if they can find a policy for him. TOC following.  Expected Discharge Plan: Home/Self Care Barriers to Discharge: Continued Medical Work up   Patient Goals and CMS Choice            Expected Discharge Plan and Services   Discharge Planning Services: CM Consult   Living arrangements for the past 2 months: Apartment                                      Prior Living Arrangements/Services Living arrangements for the past 2 months: Apartment Lives with:: Parents Patient language and need for interpreter reviewed:: Yes Do you feel safe going back to the place where you live?: Yes          Current home services: DME (cane) Criminal Activity/Legal Involvement Pertinent to Current Situation/Hospitalization: No - Comment as needed  Activities of Daily Living   ADL Screening (condition at time of admission) Independently performs ADLs?: Yes (appropriate for developmental age) Is the patient deaf or have difficulty hearing?: No Does the patient have difficulty seeing, even when wearing glasses/contacts?: No Does the patient have difficulty concentrating, remembering, or making decisions?: No  Permission Sought/Granted                  Emotional Assessment Appearance:: Appears stated age Attitude/Demeanor/Rapport: Engaged Affect (typically observed): Accepting Orientation: : Oriented to  Self, Oriented to Place, Oriented to  Time, Oriented to Situation   Psych Involvement: No (comment)  Admission diagnosis:  Subdural hematoma (HCC) [S06.5XAA] Alcoholic intoxication with complication [facility]) [F10.929] Patient Active Problem List   Diagnosis Date Noted   PVD (peripheral vascular disease) (HCC) 03/16/2024   Subdural hematoma (HCC) 03/16/2024   Alcohol use 03/16/2024   Carotid artery stenosis 03/11/2022   History of CVA (cerebrovascular accident) 03/11/2022   Tobacco user 03/11/2022   PCP:  [name], MD Pharmacy:   [id number] - [place], [place] - [address] [place] [address] Phone: [phone] Fax: [phone]  [facility] [address] [place] [address] Phone: [phone] Fax: [phone]     Social Drivers of Health (SDOH) Social History: SDOH Screenings   Food Insecurity: No Food Insecurity (03/16/2024)  Housing: Low Risk  (03/16/2024)  Transportation Needs: No Transportation Needs (03/16/2024)  Utilities: Not At Risk (03/16/2024)  Alcohol Screen: Low Risk  (07/21/2023)  Depression (PHQ2-9): Low Risk  (07/21/2023)  Financial Resource Strain: Low Risk  (07/21/2023)  Physical Activity: Insufficiently Active (07/21/2023)  Social Connections: Moderately Isolated (07/21/2023)  Stress: No Stress Concern Present (07/21/2023)  Tobacco Use: High Risk (03/16/2024)  Health Literacy: Adequate Health Literacy (07/21/2023)   SDOH Interventions:     Readmission Risk Interventions  No data to display

## 2024-03-17 NOTE — Progress Notes (Signed)
 PROGRESS NOTE    ELISANDRO Guzman  ZOX:[id number] DOB: 01-24-1961 DOA: 03/16/2024 PCP: [name], MD    Brief Narrative:  63 y.o. male with medical history significant of stroke, carotid artery disease, peripheral vascular disease presenting after a fall. CT head showed small subdural hematoma of the right lateral region with no mass effect or midline shift. CT C-spine showed no acute normality and degenerative disc disease.  Upon admission started on alcohol withdrawal protocol, folic acid, thiamine and multivitamin.  Neurosurgery did not recommend any further evaluation.   Assessment & Plan:  Principal Problem:   Subdural hematoma (HCC) Active Problems:   Carotid artery stenosis   History of CVA (cerebrovascular accident)   PVD (peripheral vascular disease) (HCC)   Alcohol use    Fall Intoxication Subdural hematoma -CT head showed small subdural hematoma.  Intoxicated in the ER.  CT cervical spine negative.  Seen by neurosurgery, no need for repeat CT according to neurosurgery.  ETOH use Alcohol withdrawal protocol.  Folic acid, thiamine and multivitamin   History of CVA History of carotid artery disease History of PVD Continue statin, holding aspirin.   PT/OT  DVT prophylaxis: SCDs Start: 03/16/24 1519      Code Status: Full Code Family Communication:   Pending therapy evaluation    Subjective: Feels okay no complaints at this time.   Examination:  General exam: Appears calm and comfortable  Respiratory system: Clear to auscultation. Respiratory effort normal. Cardiovascular system: S1 & S2 heard, RRR. No JVD, murmurs, rubs, gallops or clicks. No pedal edema. Gastrointestinal system: Abdomen is nondistended, soft and nontender. No organomegaly or masses felt. Normal bowel sounds heard. Central nervous system: Alert and oriented. No focal neurological deficits. Extremities: Symmetric 5 x 5 power. Skin: No rashes, lesions or ulcers Psychiatry: Judgement and  insight appear normal. Mood & affect appropriate.                Diet Orders (From admission, onward)     Start     Ordered   03/17/24 0207  Diet Heart Room service appropriate? Yes; Fluid consistency: Thin  Diet effective now       Question Answer Comment  Room service appropriate? Yes   Fluid consistency: Thin      03/17/24 0206            Objective: Vitals:   03/17/24 0038 03/17/24 0045 03/17/24 0355 03/17/24 0903  BP: (!) 187/90 (!) 178/86 131/71 127/82  Pulse: 63  (!) 59 (!) 59  Resp:   18 18  Temp:   98.2 F (36.8 C) 98.5 F (36.9 C)  TempSrc:   Oral   SpO2:   98% 99%  Weight:      Height:        Intake/Output Summary (Last 24 hours) at 03/17/2024 0904 Last data filed at 03/17/2024 0827 Gross per 24 hour  Intake 240 ml  Output --  Net 240 ml   Filed Weights   03/16/24 1241  Weight: 92.5 kg    Scheduled Meds:  atorvastatin   40 mg Oral Daily   folic acid  1 mg Oral Daily   multivitamin with minerals  1 tablet Oral Daily   sodium chloride  flush  3 mL Intravenous Q12H   thiamine  100 mg Oral Daily   Or   thiamine  100 mg Intravenous Daily   Continuous Infusions:  Nutritional status     Body mass index is 31.95 kg/m.  Data Reviewed:   CBC: Recent  Labs  Lab 03/16/24 1307 03/17/24 0411  WBC 6.7 8.3  NEUTROABS 2.2  --   HGB 15.3 15.2  HCT 45.5 44.7  MCV 93.4 92.0  PLT 204 224   Basic Metabolic Panel: Recent Labs  Lab 03/16/24 1307  NA 136  K 4.2  CL 103  CO2 26  GLUCOSE 112*  BUN 6*  CREATININE 1.08  CALCIUM  8.9   GFR: Estimated Creatinine Clearance: 76 mL/min (by C-G formula based on SCr of 1.08 mg/dL). Liver Function Tests: Recent Labs  Lab 03/16/24 1307  AST 21  ALT 20  ALKPHOS 98  BILITOT 0.4  PROT 7.4  ALBUMIN 3.6   Recent Labs  Lab 03/16/24 1307  LIPASE 41   No results for input(s): "AMMONIA" in the last 168 hours. Coagulation Profile: Recent Labs  Lab 03/16/24 1533  INR 1.1   Cardiac  Enzymes: No results for input(s): "CKTOTAL", "CKMB", "CKMBINDEX", "TROPONINI" in the last 168 hours. BNP (last 3 results) No results for input(s): "PROBNP" in the last 8760 hours. HbA1C: No results for input(s): "HGBA1C" in the last 72 hours. CBG: No results for input(s): "GLUCAP" in the last 168 hours. Lipid Profile: No results for input(s): "CHOL", "HDL", "LDLCALC", "TRIG", "CHOLHDL", "LDLDIRECT" in the last 72 hours. Thyroid Function Tests: No results for input(s): "TSH", "T4TOTAL", "FREET4", "T3FREE", "THYROIDAB" in the last 72 hours. Anemia Panel: No results for input(s): "VITAMINB12", "FOLATE", "FERRITIN", "TIBC", "IRON", "RETICCTPCT" in the last 72 hours. Sepsis Labs: No results for input(s): "PROCALCITON", "LATICACIDVEN" in the last 168 hours.  No results found for this or any previous visit (from the past 240 hours).       Radiology Studies: CT HEAD WO CONTRAST ( ) Result Date: 03/17/2024 EXAM: CT HEAD WITHOUT CONTRAST 03/17/2024 08:45:31 AM TECHNIQUE: CT of the head was performed without the administration of intravenous contrast. Automated exposure control, iterative reconstruction, and/or weight based adjustment of the mA/kV was utilized to reduce the radiation dose to as low as reasonably achievable. COMPARISON: CT head without contrast 03/16/2024. CLINICAL HISTORY: Subdural hematoma; Repeat to monitor for interval change. FINDINGS: BRAIN AND VENTRICLES: Previously noted right extraaxial/subdural hemorrhage is stable to slightly decreased compared to the prior study. A small left extraaxial collection is now present measuring up to 4 mm on coronal images. Atrophy and white matter changes are stable. No parenchymal hemorrhage is present. ORBITS: The visualized portion of the orbits demonstrate no acute abnormality. SINUSES: Chronic right maxillary sinus disease is present. SOFT TISSUES AND SKULL: Extensive dental disease is noted. No acute abnormality of the visualized skull or  soft tissues. VASCULATURE: Atherosclerotic calcifications are present in the cavernous carotid arteries bilaterally. No hyperdense vessel is present. IMPRESSION: 1. Stable to slightly decreased right extraaxial/subdural hemorrhage compared to the prior study. 2. New small left extraaxial collection measuring up to 4 mm on coronal images. This represents other acute subdural hemorrhage or traumatic hygroma. Recommend continued follow-up CT. Electronically signed by: Audree Leas MD 03/17/2024 08:59 AM EDT RP Workstation: [id number]   CT Head Wo Contrast Result Date: 03/16/2024 CLINICAL DATA:  Fall. EXAM: CT HEAD WITHOUT CONTRAST TECHNIQUE: Contiguous axial images were obtained from the base of the skull through the vertex without intravenous contrast. RADIATION DOSE REDUCTION: This exam was performed according to the departmental dose-optimization program which includes automated exposure control, adjustment of the mA and/or kV according to patient size and/or use of iterative reconstruction technique. COMPARISON:  09/19/2022 FINDINGS: Brain: Small subdural hematoma noted over the right cerebral hemisphere measuring 3 mm  in thickness. Similarly sized subdural hematoma layering posteriorly along the right side of the falx. No mass effect or midline shift. No intraparenchymal hemorrhage. There is atrophy and chronic small vessel disease changes. Vascular: No hyperdense vessel or unexpected calcification. Skull: No acute calvarial abnormality. Sinuses/Orbits: No acute findings. Other: None IMPRESSION: Small subdural hematoma laterally over the right cerebral hemisphere and along the right posterior falx. No mass effect or midline shift. These results were called by telephone at the time of interpretation on 03/16/2024 at 2:17 pm to provider [facility] , who verbally acknowledged these results. Electronically Signed   By: Janeece Mechanic M.D.   On: 03/16/2024 14:20   DG Shoulder Right Result Date:  03/16/2024 CLINICAL DATA:  Degenerative changes in the right [facility] joint. No acute bony abnormality. EXAM: RIGHT SHOULDER - 2+ VIEW COMPARISON:  None Available. FINDINGS: Degenerative changes in the [facility] joint with joint space narrowing and spurring. Glenohumeral joint is maintained. No acute bony abnormality. Specifically, no fracture, subluxation, or dislocation. Soft tissues are intact. IMPRESSION: Changes in the right [facility] joint Electronically Signed   By: Janeece Mechanic M.D.   On: 03/16/2024 14:15   CT Cervical Spine Wo Contrast Result Date: 03/16/2024 CLINICAL DATA:  Polytrauma, blunt.  Fall. EXAM: CT CERVICAL SPINE WITHOUT CONTRAST TECHNIQUE: Multidetector CT imaging of the cervical spine was performed without intravenous contrast. Multiplanar CT image reconstructions were also generated. RADIATION DOSE REDUCTION: This exam was performed according to the departmental dose-optimization program which includes automated exposure control, adjustment of the mA and/or kV according to patient size and/or use of iterative reconstruction technique. COMPARISON:  09/19/2022 FINDINGS: Alignment: Normal Skull base and vertebrae: No acute fracture. No primary bone lesion or focal pathologic process. Soft tissues and spinal canal: No prevertebral fluid or swelling. No visible canal hematoma. Disc levels: Diffuse degenerative disc disease with disc space narrowing and anterior spurring. Upper chest: No acute findings Other: None IMPRESSION: Degenerative disc disease.  No acute bony abnormality. Electronically Signed   By: Janeece Mechanic M.D.   On: 03/16/2024 14:15           LOS: 0 days   Time spent= 35 mins    Maggie Schooner, MD [facility]  If 7PM-7AM, please contact night-coverage  03/17/2024, 9:04 AM

## 2024-03-17 NOTE — Plan of Care (Signed)
   Problem: Education: Goal: Knowledge of General Education information will improve Description Including pain rating scale, medication(s)/side effects and non-pharmacologic comfort measures Outcome: Progressing

## 2024-03-17 NOTE — TOC CAGE-AID Note (Signed)
 Transition of Care [facility]) - CAGE-AID Screening   Patient Details  Name: Devin Guzman MRN: [id number] Date of Birth: June 15, 1961  Transition of [facility]) CM/SW Contact:    Jonathan Neighbor, RN Phone Number: 03/17/2024, 11:15 AM   Clinical Narrative:  Pt admits to a beer or 2. He denies the need for inpatient/ outpatient counseling resources.   CAGE-AID Screening:    Have You Ever Felt You Ought to Cut Down on Your Drinking or Drug Use?: Yes Have People Annoyed You By Critizing Your Drinking Or Drug Use?: Yes Have You Felt Bad Or Guilty About Your Drinking Or Drug Use?: Yes Have You Ever Had a Drink or Used Drugs First Thing In The Morning to Steady Your Nerves or to Get Rid of a Hangover?: No CAGE-AID Score: 3  Substance Abuse Education Offered: Yes (refused)

## 2024-03-17 NOTE — Plan of Care (Signed)
   Problem: Health Behavior/Discharge Planning: Goal: Ability to manage health-related needs will improve Outcome: Progressing   Problem: Clinical Measurements: Goal: Ability to maintain clinical measurements within normal limits will improve Outcome: Progressing

## 2024-03-17 NOTE — Hospital Course (Addendum)
 Brief Narrative:  63 y.o. male with medical history significant of stroke, carotid artery disease, peripheral vascular disease presenting after a fall. CT head showed small subdural hematoma of the right lateral region with no mass effect or midline shift. CT C-spine showed no acute normality and degenerative disc disease.  Upon admission started on alcohol withdrawal protocol, folic acid , thiamine  and multivitamin.  Neurosurgery did not recommend any further evaluation.  Will discharge the patient once seen by physical therapy.   Assessment & Plan:  Principal Problem:   Subdural hematoma (HCC) Active Problems:   Carotid artery stenosis   History of CVA (cerebrovascular accident)   PVD (peripheral vascular disease) (HCC)   Alcohol use    Fall Intoxication Subdural hematoma -CT head showed small subdural hematoma.  Intoxicated in the ER.  CT cervical spine negative.  Seen by neurosurgery, no need for repeat CT according to neurosurgery.  ETOH use Alcohol withdrawal protocol.  Folic acid , thiamine  and multivitamin   History of CVA History of carotid artery disease History of PVD Continue statin, holding aspirin.   PT/OT  DVT prophylaxis: SCDs Start: 03/16/24 1519    Code Status: Full Code Family Communication:   Discharge once seen by physical therapy    Subjective: Feeling okay no complaints.   Examination:  General exam: Appears calm and comfortable  Respiratory system: Clear to auscultation. Respiratory effort normal. Cardiovascular system: S1 & S2 heard, RRR. No JVD, murmurs, rubs, gallops or clicks. No pedal edema. Gastrointestinal system: Abdomen is nondistended, soft and nontender. No organomegaly or masses felt. Normal bowel sounds heard. Central nervous system: Alert and oriented. No focal neurological deficits. Extremities: Symmetric 5 x 5 power. Skin: No rashes, lesions or ulcers Psychiatry: Judgement and insight appear normal. Mood & affect appropriate.

## 2024-03-18 LAB — CBC
HCT: 43.4 % (ref 39.0–52.0)
Hemoglobin: 14.8 g/dL (ref 13.0–17.0)
MCH: 31.9 pg (ref 26.0–34.0)
MCHC: 34.1 g/dL (ref 30.0–36.0)
MCV: 93.5 fL (ref 80.0–100.0)
Platelets: 186 10*3/uL (ref 150–400)
RBC: 4.64 MIL/uL (ref 4.22–5.81)
RDW: 15.1 % (ref 11.5–15.5)
WBC: 8.2 10*3/uL (ref 4.0–10.5)
nRBC: 0 % (ref 0.0–0.2)

## 2024-03-18 LAB — BASIC METABOLIC PANEL WITH GFR
Anion gap: 8 (ref 5–15)
BUN: 12 mg/dL (ref 8–23)
CO2: 24 mmol/L (ref 22–32)
Calcium: 9.3 mg/dL (ref 8.9–10.3)
Chloride: 104 mmol/L (ref 98–111)
Creatinine, Ser: 1.08 mg/dL (ref 0.61–1.24)
GFR, Estimated: >60 mL/min (ref >60)
Glucose, Bld: 99 mg/dL (ref 70–99)
Potassium: 4 mmol/L (ref 3.5–5.1)
Sodium: 136 mmol/L (ref 135–145)

## 2024-03-18 LAB — MAGNESIUM: Magnesium: 2.1 mg/dL (ref 1.7–2.4)

## 2024-03-18 LAB — PHOSPHORUS: Phosphorus: 3.8 mg/dL (ref 2.5–4.6)

## 2024-03-18 NOTE — Plan of Care (Signed)
  Problem: Education: Goal: Knowledge of General Education information will improve Description: Including pain rating scale, medication(s)/side effects and non-pharmacologic comfort measures Outcome: Adequate for Discharge   Problem: Health Behavior/Discharge Planning: Goal: Ability to manage health-related needs will improve Outcome: Adequate for Discharge   Problem: Clinical Measurements: Goal: Ability to maintain clinical measurements within normal limits will improve Outcome: Adequate for Discharge Goal: Will remain free from infection Outcome: Adequate for Discharge Goal: Diagnostic test results will improve Outcome: Adequate for Discharge Goal: Respiratory complications will improve Outcome: Adequate for Discharge Goal: Cardiovascular complication will be avoided Outcome: Adequate for Discharge   Problem: Activity: Goal: Risk for activity intolerance will decrease Outcome: Adequate for Discharge   Problem: Nutrition: Goal: Adequate nutrition will be maintained Outcome: Adequate for Discharge   Problem: Coping: Goal: Level of anxiety will decrease Outcome: Adequate for Discharge   Problem: Elimination: Goal: Will not experience complications related to bowel motility Outcome: Adequate for Discharge Goal: Will not experience complications related to urinary retention Outcome: Adequate for Discharge   Problem: Pain Managment: Goal: General experience of comfort will improve and/or be controlled Outcome: Adequate for Discharge   Problem: Safety: Goal: Ability to remain free from injury will improve Outcome: Adequate for Discharge   Problem: Skin Integrity: Goal: Risk for impaired skin integrity will decrease Outcome: Adequate for Discharge  Pt D/C home, removed IV, belongingsreturned. AVS reviewed and follow up questions answered.  BP (!) 160/80 (BP Location: Right Arm)   Pulse (!) 50   Temp 98.7 F (37.1 C) (Oral)   Resp 19   Ht 5\' 7"  (1.702 m)   Wt 92.5  kg   SpO2 99%   BMI 31.95 kg/m  03/18/24 1:23 PM Devin Guzman

## 2024-03-18 NOTE — Evaluation (Signed)
 Physical Therapy Evaluation Patient Details Name: Devin Guzman MRN: [id number] DOB: 09-Dec-1960 Today's Date: 03/18/2024  History of Present Illness  Pt is a 63 y.o. male presenting 6/5 after fall walking home from friends house after night of drinking. Imaging showed small, stable SDH. PMH includes: ETOH use, CVA, CAD, and PVD.  Clinical Impression  Pt in bed upon arrival of PT, agreeable to evaluation at this time. Prior to admission the pt was independent with all mobility, reports no other falls, various family assist as needed, and living in condo with his mother with flight of stairs to bedroom/bathroom. The pt does report intermittent walking around the complex, but reports otherwise sedentary at home. Pt standing in room upon my arrival, able to demo sit-stand without need for DME or assist, and completed hallway ambulation with no DME and supervision. The pt was then able to complete ~15 stairs with single rail with good stability and no need for assist. Pt reports mobility feels to be at baseline, though deficits in dynamic balance, especially with head movements and turning, were noted during balance challenge. Pt reports he has various family members available and feels he is mobilizing at his baseline. No further acute PT needs identified.      If plan is discharge home, recommend the following: Help with stairs or ramp for entrance   Can travel by private vehicle        Equipment Recommendations None recommended by PT  Recommendations for Other Services       Functional Status Assessment Patient has not had a recent decline in their functional status     Precautions / Restrictions Precautions Precautions: Fall Recall of Precautions/Restrictions: Intact Restrictions Weight Bearing Restrictions Per Provider Order: No      Mobility  Bed Mobility Overal bed mobility: Independent             General bed mobility comments: pt OOB standing in room upon arrival     Transfers Overall transfer level: Independent Equipment used: None               General transfer comment: pt OOB standing in room upon arrival, was able to complete without DME or need for assist    Ambulation/Gait Ambulation/Gait assistance: Supervision Gait Distance (Feet): 150 Feet Assistive device: None Gait Pattern/deviations: Step-through pattern, Decreased stride length, Decreased dorsiflexion - right, Decreased dorsiflexion - left, Wide base of support Gait velocity: 0.45 m/s Gait velocity interpretation: 1.31 - 2.62 ft/sec, indicative of limited community ambulator   General Gait Details: pt with wide BOS and increased sway, reports painful walking on hospital socks. no buckling, LOB, or need for UE support  Stairs Stairs: Yes Stairs assistance: Supervision Stair Management: One rail Left, Alternating pattern, Forwards Number of Stairs: 10 General stair comments: with use of single rail     Balance Overall balance assessment: Mild deficits observed, not formally tested                               Standardized Balance Assessment Standardized Balance Assessment : Dynamic Gait Index   Dynamic Gait Index Level Surface: Mild Impairment Change in Gait Speed: Mild Impairment Gait with Horizontal Head Turns: Mild Impairment Gait with Vertical Head Turns: Mild Impairment Gait and Pivot Turn: Moderate Impairment Step Over Obstacle: Mild Impairment Step Around Obstacles: Mild Impairment Steps: Mild Impairment Total Score: 15       Pertinent Vitals/Pain Pain Assessment Pain Assessment: No/denies  pain    Home Living Family/patient expects to be discharged to:: Private residence Living Arrangements: Parent (mother) Available Help at Discharge: Family;Available PRN/intermittently Type of Home: Apartment Home Access: Level entry     Alternate Level Stairs-Number of Steps: flight Home Layout: Two level;Bed/bath upstairs Home Equipment: Grab  bars - tub/shower;Crutches;Cane - single point      Prior Function Prior Level of Function : Independent/Modified Independent;Driving             Mobility Comments: no use of DME, no falls ADLs Comments: pt reports independence     Extremity/Trunk Assessment   Upper Extremity Assessment Upper Extremity Assessment: Defer to OT evaluation    Lower Extremity Assessment Lower Extremity Assessment: Overall WFL for tasks assessed    Cervical / Trunk Assessment Cervical / Trunk Assessment: Normal  Communication   Communication Communication: No apparent difficulties    Cognition Arousal: Alert Behavior During Therapy: WFL for tasks assessed/performed   PT - Cognitive impairments: No apparent impairments                         Following commands: Intact       Cueing Cueing Techniques: Verbal cues            Assessment/Plan    PT Assessment Patient does not need any further PT services         PT Goals (Current goals can be found in the Care Plan section)  Acute Rehab PT Goals Patient Stated Goal: to return home PT Goal Formulation: All assessment and education complete, DC therapy Time For Goal Achievement: 03/25/24 Potential to Achieve Goals: Good     AM-PAC PT "6 Clicks" Mobility  Outcome Measure Help needed turning from your back to your side while in a flat bed without using bedrails?: None Help needed moving from lying on your back to sitting on the side of a flat bed without using bedrails?: None Help needed moving to and from a bed to a chair (including a wheelchair)?: None Help needed standing up from a chair using your arms (e.g., wheelchair or bedside chair)?: None Help needed to walk in hospital room?: A Little Help needed climbing 3-5 steps with a railing? : A Little 6 Click Score: 22    End of Session Equipment Utilized During Treatment: Gait belt Activity Tolerance: Patient tolerated treatment well Patient left: in bed;with  call bell/phone within reach (with OT) Nurse Communication: Mobility status PT Visit Diagnosis: Unsteadiness on feet (R26.81)    Time: 1000-1011 PT Time Calculation (min) (ACUTE ONLY): 11 min   Charges:   PT Evaluation $PT Eval Low Complexity: 1 Low   PT General Charges $$ ACUTE PT VISIT: 1 Visit         Barnabas Booth, PT, DPT   Acute Rehabilitation Department Office [phone] Secure Chat Communication Preferred  Lona Rist 03/18/2024, 10:20 AM

## 2024-03-18 NOTE — Plan of Care (Signed)
  Problem: Clinical Measurements: Goal: Ability to maintain clinical measurements within normal limits will improve Outcome: Progressing Goal: Will remain free from infection Outcome: Progressing Goal: Respiratory complications will improve Outcome: Progressing   Problem: Nutrition: Goal: Adequate nutrition will be maintained Outcome: Progressing   Problem: Elimination: Goal: Will not experience complications related to urinary retention Outcome: Progressing

## 2024-03-18 NOTE — Evaluation (Signed)
 Occupational Therapy Evaluation Patient Details Name: Devin Guzman MRN: [id number] DOB: 03-03-1961 Today's Date: 03/18/2024   History of Present Illness   Pt is a 63 y.o. male presenting 6/5 after fall walking home from friends house after night of drinking. Imaging showed small, stable SDH. PMH includes: ETOH use, CVA, CAD, and PVD.     Clinical Impressions PTA, pt lived with mother and reports being independent in ADL and IADL, but pt does not cook reporting sister provides meals. Pt performing BADL with mod I this session. Noting mild cognitive deficits with pt scoring a 6 on the short blessed test; however, pt reveals he feels as though his cognition is near baseline. Will continue to assess acutely; do not however suspect need for follow up OT after discharge.      If plan is discharge home, recommend the following:   A little help with walking and/or transfers;A little help with bathing/dressing/bathroom;Assistance with cooking/housework     Functional Status Assessment   Patient has had a recent decline in their functional status and demonstrates the ability to make significant improvements in function in a reasonable and predictable amount of time.     Equipment Recommendations   None recommended by OT     Recommendations for Other Services   Speech consult     Precautions/Restrictions   Precautions Precautions: Fall Recall of Precautions/Restrictions: Intact Restrictions Weight Bearing Restrictions Per Provider Order: No     Mobility Bed Mobility Overal bed mobility: Independent             General bed mobility comments: pt OOB standing in room upon arrival    Transfers Overall transfer level: Independent Equipment used: None               General transfer comment: pt OOB standing in room upon arrival, was able to complete without DME or need for assist      Balance Overall balance assessment: Mild deficits observed, not formally  tested                                         ADL either performed or assessed with clinical judgement   ADL Overall ADL's : Modified independent                                       General ADL Comments: increased time; noting mild balance deficits with mobility, but pt reports this to be his baseline and with no over LOB noted     Vision Baseline Vision/History: 0 No visual deficits Ability to See in Adequate Light: 0 Adequate Patient Visual Report: No change from baseline Vision Assessment?: No apparent visual deficits;Wears glasses for reading Additional Comments: WFL visual screen     Perception         Praxis         Pertinent Vitals/Pain Pain Assessment Pain Assessment: No/denies pain     Extremity/Trunk Assessment Upper Extremity Assessment Upper Extremity Assessment: Overall WFL for tasks assessed;Right hand dominant;RUE deficits/detail RUE Deficits / Details: R shoulder arthritis; intermittent AAROM provided by LUE to get RUE above head   Lower Extremity Assessment Lower Extremity Assessment: Defer to PT evaluation   Cervical / Trunk Assessment Cervical / Trunk Assessment: Normal   Communication Communication Communication: No apparent difficulties   Cognition  Arousal: Alert Behavior During Therapy: WFL for tasks assessed/performed Cognition: No family/caregiver present to determine baseline             OT - Cognition Comments: Pt scored a 6 on the short blessed test with 4/5 on delayed memory recall and unable to complete naming months of year in reverse order. pt aware of errors and reports he feels he is at his baseline. Able to answer basic questions regarding safety with medication management.                 Following commands: Intact       Cueing  General Comments   Cueing Techniques: Verbal cues  VSS   Exercises     Shoulder Instructions      Home Living Family/patient expects to be  discharged to:: Private residence Living Arrangements: Parent (mother) Available Help at Discharge: Family;Available PRN/intermittently Type of Home: Apartment Home Access: Level entry     Home Layout: Two level;Bed/bath upstairs Alternate Level Stairs-Number of Steps: flight Alternate Level Stairs-Rails: Left Bathroom Shower/Tub: Producer, television/film/video: Standard     Home Equipment: Grab bars - tub/shower;Crutches;Cane - single point          Prior Functioning/Environment Prior Level of Function : Independent/Modified Independent;Driving             Mobility Comments: no use of DME, no falls ADLs Comments: pt reports independence with ADL and IADL within the home, drives; sisters bring him and his mother meals    OT Problem List: Decreased activity tolerance;Impaired balance (sitting and/or standing);Decreased cognition   OT Treatment/Interventions: Self-care/ADL training;Therapeutic exercise;DME and/or AE instruction;Cognitive remediation/compensation;Patient/family education;Balance training      OT Goals(Current goals can be found in the care plan section)   Acute Rehab OT Goals Patient Stated Goal: get home OT Goal Formulation: With patient Time For Goal Achievement: 04/01/24 Potential to Achieve Goals: Good   OT Frequency:  Min 1X/week    Co-evaluation              AM-PAC OT "6 Clicks" Daily Activity     Outcome Measure Help from another person eating meals?: None Help from another person taking care of personal grooming?: None Help from another person toileting, which includes using toliet, bedpan, or urinal?: None Help from another person bathing (including washing, rinsing, drying)?: None Help from another person to put on and taking off regular upper body clothing?: None Help from another person to put on and taking off regular lower body clothing?: None 6 Click Score: 24   End of Session Equipment Utilized During Treatment: Gait  belt Nurse Communication: Mobility status  Activity Tolerance: Patient tolerated treatment well Patient left: in bed;with call bell/phone within reach  OT Visit Diagnosis: Unsteadiness on feet (R26.81);Muscle weakness (generalized) (M62.81);Other symptoms and signs involving cognitive function                Time: 1009-1020 OT Time Calculation (min): 11 min Charges:  OT General Charges $OT Visit: 1 Visit OT Evaluation $OT Eval Low Complexity: 1 Low  Karilyn Ouch, OTR/L [facility] Acute Rehabilitation Office: [phone]   Emery Hans 03/18/2024, 11:23 AM

## 2024-03-18 NOTE — Discharge Summary (Signed)
 Physician Discharge Summary  Devin Guzman:[id number] DOB: 1961-08-21 DOA: 03/16/2024  PCP: Devin Abo, MD  Admit date: 03/16/2024 Discharge date: 03/18/2024  Admitted From: Home Disposition: Home  Recommendations for Outpatient Follow-up:  Follow up with PCP in 1-2 weeks Please obtain BMP/CBC in one week your next doctors visit.  Advised to hold aspirin for minimum 1 week Counseled to quit drinking alcohol   Discharge Condition: Stable CODE STATUS: Full code Diet recommendation: Heart healthy  Brief/Interim Summary: Brief Narrative:  63 y.o. male with medical history significant of stroke, carotid artery disease, peripheral vascular disease presenting after a fall. CT head showed small subdural hematoma of the right lateral region with no mass effect or midline shift. CT C-spine showed no acute normality and degenerative disc disease.  Upon admission started on alcohol withdrawal protocol, folic acid , thiamine  and multivitamin.  Neurosurgery did not recommend any further evaluation.  Will discharge the patient once seen by physical therapy.   Assessment & Plan:  Principal Problem:   Subdural hematoma (HCC) Active Problems:   Carotid artery stenosis   History of CVA (cerebrovascular accident)   PVD (peripheral vascular disease) (HCC)   Alcohol use    Fall Intoxication Subdural hematoma -CT head showed small subdural hematoma.  Intoxicated in the ER.  CT cervical spine negative.  Seen by neurosurgery, no need for repeat CT according to neurosurgery.  ETOH use Alcohol withdrawal protocol.  Folic acid , thiamine  and multivitamin   History of CVA History of carotid artery disease History of PVD Continue statin, holding aspirin.   PT/OT  DVT prophylaxis: SCDs Start: 03/16/24 1519    Code Status: Full Code Family Communication:   Discharge once seen by physical therapy    Subjective: Feeling okay no complaints.   Examination:  General exam: Appears calm and  comfortable  Respiratory system: Clear to auscultation. Respiratory effort normal. Cardiovascular system: S1 & S2 heard, RRR. No JVD, murmurs, rubs, gallops or clicks. No pedal edema. Gastrointestinal system: Abdomen is nondistended, soft and nontender. No organomegaly or masses felt. Normal bowel sounds heard. Central nervous system: Alert and oriented. No focal neurological deficits. Extremities: Symmetric 5 x 5 power. Skin: No rashes, lesions or ulcers Psychiatry: Judgement and insight appear normal. Mood & affect appropriate.    Discharge Diagnoses:  Principal Problem:   Subdural hematoma (HCC) Active Problems:   Carotid artery stenosis   History of CVA (cerebrovascular accident)   PVD (peripheral vascular disease) (HCC)   Alcohol use      Discharge Exam: Vitals:   03/18/24 0412 03/18/24 0725  BP: (!) 161/82 (!) 153/84  Pulse: (!) 51 (!) 48  Resp: 14 19  Temp: 98.3 F (36.8 C) 98.6 F (37 C)  SpO2: 100% 98%   Vitals:   03/17/24 2154 03/17/24 2339 03/18/24 0412 03/18/24 0725  BP: (!) 146/81 (!) 158/89 (!) 161/82 (!) 153/84  Pulse: 62 (!) 51 (!) 51 (!) 48  Resp: 18 12 14 19   Temp: 98 F (36.7 C) 98.3 F (36.8 C) 98.3 F (36.8 C) 98.6 F (37 C)  TempSrc: Oral Axillary Axillary Oral  SpO2: 98% 99% 100% 98%  Weight:      Height:          Discharge Instructions   Allergies as of 03/18/2024       Reactions   Codeine Other (See Comments)   Cause Shaking        Medication List     PAUSE taking these medications    aspirin  EC 81 MG tablet Wait to take this until: March 23, 2024 Take 81 mg by mouth daily. Swallow whole.       STOP taking these medications    doxycycline  100 MG tablet Commonly known as: VIBRA -TABS       TAKE these medications    atorvastatin  40 MG tablet Commonly known as: LIPITOR TAKE 1 TABLET BY MOUTH EVERY DAY        Follow-up Information     Devin Abo, MD Follow up in 1 week(s).   Specialty: Family  Medicine Contact information: [address] [place] [address] [phone]                Allergies  Allergen Reactions   Codeine Other (See Comments)    Cause Shaking    You were cared for by a hospitalist during your hospital stay. If you have any questions about your discharge medications or the care you received while you were in the hospital after you are discharged, you can call the unit and asked to speak with the hospitalist on call if the hospitalist that took care of you is not available. Once you are discharged, your primary care physician will handle any further medical issues. Please note that no refills for any discharge medications will be authorized once you are discharged, as it is imperative that you return to your primary care physician (or establish a relationship with a primary care physician if you do not have one) for your aftercare needs so that they can reassess your need for medications and monitor your lab values.  You were cared for by a hospitalist during your hospital stay. If you have any questions about your discharge medications or the care you received while you were in the hospital after you are discharged, you can call the unit and asked to speak with the hospitalist on call if the hospitalist that took care of you is not available. Once you are discharged, your primary care physician will handle any further medical issues. Please note that NO REFILLS for any discharge medications will be authorized once you are discharged, as it is imperative that you return to your primary care physician (or establish a relationship with a primary care physician if you do not have one) for your aftercare needs so that they can reassess your need for medications and monitor your lab values.  Please request your Prim.MD to go over all Hospital Tests and Procedure/Radiological results at the follow up, please get all Hospital records sent to your Prim MD by  signing hospital release before you go home.  Get CBC, CMP, 2 view Chest X ray checked  by Primary MD during your next visit or SNF MD in 5-7 days ( we routinely change or add medications that can affect your baseline labs and fluid status, therefore we recommend that you get the mentioned basic workup next visit with your PCP, your PCP may decide not to get them or add new tests based on their clinical decision)  On your next visit with your primary care physician please Get Medicines reviewed and adjusted.  If you experience worsening of your admission symptoms, develop shortness of breath, life threatening emergency, suicidal or homicidal thoughts you must seek medical attention immediately by calling 911 or calling your MD immediately  if symptoms less severe.  You Must read complete instructions/literature along with all the possible adverse reactions/side effects for all the Medicines you take and that have been prescribed to  you. Take any new Medicines after you have completely understood and accpet all the possible adverse reactions/side effects.   Do not drive, operate heavy machinery, perform activities at heights, swimming or participation in water activities or provide baby sitting services if your were admitted for syncope or siezures until you have seen by Primary MD or a Neurologist and advised to do so again.  Do not drive when taking Pain medications.   Procedures/Studies: CT HEAD WO CONTRAST ( ) Result Date: 03/17/2024 EXAM: CT HEAD WITHOUT CONTRAST 03/17/2024 08:45:31 AM TECHNIQUE: CT of the head was performed without the administration of intravenous contrast. Automated exposure control, iterative reconstruction, and/or weight based adjustment of the mA/kV was utilized to reduce the radiation dose to as low as reasonably achievable. COMPARISON: CT head without contrast 03/16/2024. CLINICAL HISTORY: Subdural hematoma; Repeat to monitor for interval change. FINDINGS: BRAIN AND  VENTRICLES: Previously noted right extraaxial/subdural hemorrhage is stable to slightly decreased compared to the prior study. A small left extraaxial collection is now present measuring up to 4 mm on coronal images. Atrophy and white matter changes are stable. No parenchymal hemorrhage is present. ORBITS: The visualized portion of the orbits demonstrate no acute abnormality. SINUSES: Chronic right maxillary sinus disease is present. SOFT TISSUES AND SKULL: Extensive dental disease is noted. No acute abnormality of the visualized skull or soft tissues. VASCULATURE: Atherosclerotic calcifications are present in the cavernous carotid arteries bilaterally. No hyperdense vessel is present. IMPRESSION: 1. Stable to slightly decreased right extraaxial/subdural hemorrhage compared to the prior study. 2. New small left extraaxial collection measuring up to 4 mm on coronal images. This represents other acute subdural hemorrhage or traumatic hygroma. Recommend continued follow-up CT. Electronically signed by: Audree Leas MD 03/17/2024 08:59 AM EDT RP Workstation: [id number]   CT Head Wo Contrast Result Date: 03/16/2024 CLINICAL DATA:  Fall. EXAM: CT HEAD WITHOUT CONTRAST TECHNIQUE: Contiguous axial images were obtained from the base of the skull through the vertex without intravenous contrast. RADIATION DOSE REDUCTION: This exam was performed according to the departmental dose-optimization program which includes automated exposure control, adjustment of the mA and/or kV according to patient size and/or use of iterative reconstruction technique. COMPARISON:  09/19/2022 FINDINGS: Brain: Small subdural hematoma noted over the right cerebral hemisphere measuring 3 mm in thickness. Similarly sized subdural hematoma layering posteriorly along the right side of the falx. No mass effect or midline shift. No intraparenchymal hemorrhage. There is atrophy and chronic small vessel disease changes. Vascular: No hyperdense vessel  or unexpected calcification. Skull: No acute calvarial abnormality. Sinuses/Orbits: No acute findings. Other: None IMPRESSION: Small subdural hematoma laterally over the right cerebral hemisphere and along the right posterior falx. No mass effect or midline shift. These results were called by telephone at the time of interpretation on 03/16/2024 at 2:17 pm to provider [facility] , who verbally acknowledged these results. Electronically Signed   By: Janeece Mechanic M.D.   On: 03/16/2024 14:20   DG Shoulder Right Result Date: 03/16/2024 CLINICAL DATA:  Degenerative changes in the right [facility] joint. No acute bony abnormality. EXAM: RIGHT SHOULDER - 2+ VIEW COMPARISON:  None Available. FINDINGS: Degenerative changes in the [facility] joint with joint space narrowing and spurring. Glenohumeral joint is maintained. No acute bony abnormality. Specifically, no fracture, subluxation, or dislocation. Soft tissues are intact. IMPRESSION: Changes in the right [facility] joint Electronically Signed   By: Janeece Mechanic M.D.   On: 03/16/2024 14:15   CT Cervical Spine Wo Contrast Result Date: 03/16/2024 CLINICAL DATA:  Polytrauma, blunt.  Fall. EXAM: CT CERVICAL SPINE WITHOUT CONTRAST TECHNIQUE: Multidetector CT imaging of the cervical spine was performed without intravenous contrast. Multiplanar CT image reconstructions were also generated. RADIATION DOSE REDUCTION: This exam was performed according to the departmental dose-optimization program which includes automated exposure control, adjustment of the mA and/or kV according to patient size and/or use of iterative reconstruction technique. COMPARISON:  09/19/2022 FINDINGS: Alignment: Normal Skull base and vertebrae: No acute fracture. No primary bone lesion or focal pathologic process. Soft tissues and spinal canal: No prevertebral fluid or swelling. No visible canal hematoma. Disc levels: Diffuse degenerative disc disease with disc space narrowing and anterior spurring. Upper chest: No acute  findings Other: None IMPRESSION: Degenerative disc disease.  No acute bony abnormality. Electronically Signed   By: Janeece Mechanic M.D.   On: 03/16/2024 14:15     The results of significant diagnostics from this hospitalization (including imaging, microbiology, ancillary and laboratory) are listed below for reference.     Microbiology: No results found for this or any previous visit (from the past 240 hours).   Labs: BNP (last 3 results) No results for input(s): "BNP" in the last 8760 hours. Basic Metabolic Panel: Recent Labs  Lab 03/16/24 1307 03/17/24 0857 03/18/24 0442  NA 136  --  136  K 4.2  --  4.0  CL 103  --  104  CO2 26  --  24  GLUCOSE 112*  --  99  BUN 6*  --  12  CREATININE 1.08  --  1.08  CALCIUM  8.9  --  9.3  MG  --  2.1 2.1  PHOS  --   --  3.8   Liver Function Tests: Recent Labs  Lab 03/16/24 1307  AST 21  ALT 20  ALKPHOS 98  BILITOT 0.4  PROT 7.4  ALBUMIN 3.6   Recent Labs  Lab 03/16/24 1307  LIPASE 41   No results for input(s): "AMMONIA" in the last 168 hours. CBC: Recent Labs  Lab 03/16/24 1307 03/17/24 0411 03/18/24 0442  WBC 6.7 8.3 8.2  NEUTROABS 2.2  --   --   HGB 15.3 15.2 14.8  HCT 45.5 44.7 43.4  MCV 93.4 92.0 93.5  PLT 204 224 186   Cardiac Enzymes: No results for input(s): "CKTOTAL", "CKMB", "CKMBINDEX", "TROPONINI" in the last 168 hours. BNP: Invalid input(s): "POCBNP" CBG: No results for input(s): "GLUCAP" in the last 168 hours. D-Dimer No results for input(s): "DDIMER" in the last 72 hours. Hgb A1c No results for input(s): "HGBA1C" in the last 72 hours. Lipid Profile No results for input(s): "CHOL", "HDL", "LDLCALC", "TRIG", "CHOLHDL", "LDLDIRECT" in the last 72 hours. Thyroid function studies No results for input(s): "TSH", "T4TOTAL", "T3FREE", "THYROIDAB" in the last 72 hours.  Invalid input(s): "FREET3" Anemia work up No results for input(s): "VITAMINB12", "FOLATE", "FERRITIN", "TIBC", "IRON", "RETICCTPCT" in  the last 72 hours. Urinalysis    Component Value Date/Time   COLORURINE COLORLESS (A) 03/16/2024 1307   APPEARANCEUR CLEAR 03/16/2024 1307   LABSPEC 1.001 (L) 03/16/2024 1307   PHURINE 5.0 03/16/2024 1307   GLUCOSEU NEGATIVE 03/16/2024 1307   HGBUR SMALL (A) 03/16/2024 1307   BILIRUBINUR NEGATIVE 03/16/2024 1307   KETONESUR NEGATIVE 03/16/2024 1307   PROTEINUR NEGATIVE 03/16/2024 1307   UROBILINOGEN 1.0 03/03/2010 1756   NITRITE NEGATIVE 03/16/2024 1307   LEUKOCYTESUR NEGATIVE 03/16/2024 1307   Sepsis Labs Recent Labs  Lab 03/16/24 1307 03/17/24 0411 03/18/24 0442  WBC 6.7 8.3 8.2   Microbiology No  results found for this or any previous visit (from the past 240 hours).   Time coordinating discharge:  I have spent 35 minutes face to face with the patient and on the ward discussing the patients care, assessment, plan and disposition with other care givers. >50% of the time was devoted counseling the patient about the risks and benefits of treatment/Discharge disposition and coordinating care.   SIGNED:   Maggie Schooner, MD  [facility] 03/18/2024, 10:10 AM   If 7PM-7AM, please contact night-coverage

## 2024-03-20 ENCOUNTER — Telehealth: Payer: Self-pay

## 2024-03-20 NOTE — Transitions of Care (Post Inpatient/ED Visit) (Unsigned)
   03/20/2024  Name: Devin Guzman MRN: [id number] DOB: Dec 06, 1960  Today's TOC FU Call Status: Today's TOC FU Call Status:: Unsuccessful Call (1st Attempt) Unsuccessful Call (1st Attempt) Date: 03/20/24  Attempted to reach the patient regarding the most recent Inpatient/ED visit.  Follow Up Plan: Additional outreach attempts will be made to reach the patient to complete the Transitions of Care (Post Inpatient/ED visit) call.   Signature Darrall Ellison, LPN [facility] Nurse Health Advisor Direct Dial [phone]

## 2024-03-21 NOTE — Transitions of Care (Post Inpatient/ED Visit) (Unsigned)
   03/21/2024  Name: Devin Guzman MRN: [id number] DOB: 1961-01-01  Today's TOC FU Call Status: Today's TOC FU Call Status:: Unsuccessful Call (2nd Attempt) Unsuccessful Call (1st Attempt) Date: 03/20/24 Unsuccessful Call (2nd Attempt) Date: 03/21/24  Attempted to reach the patient regarding the most recent Inpatient/ED visit.  Follow Up Plan: Additional outreach attempts will be made to reach the patient to complete the Transitions of Care (Post Inpatient/ED visit) call.   Signature Darrall Ellison, LPN [facility] Nurse Health Advisor Direct Dial [phone]

## 2024-03-23 NOTE — Transitions of Care (Post Inpatient/ED Visit) (Signed)
   03/23/2024  Name: Devin Guzman MRN: [id number] DOB: January 01, 1961  Today's TOC FU Call Status: Today's TOC FU Call Status:: Successful TOC FU Call Completed Unsuccessful Call (1st Attempt) Date: 03/20/24 Unsuccessful Call (2nd Attempt) Date: 03/21/24 [facility] FU Call Complete Date: 03/23/24 Patient's Name and Date of Birth confirmed.  Transition Care Management Follow-up Telephone Call Date of Discharge: 03/18/24 Discharge Facility: [facility]) Type of Discharge: Inpatient Admission Primary Inpatient Discharge Diagnosis:: sudural hemorrhage How have you been since you were released from the hospital?: Better Any questions or concerns?: No  Items Reviewed: Did you receive and understand the discharge instructions provided?: Yes Medications obtained,verified, and reconciled?: Yes (Medications Reviewed) Any new allergies since your discharge?: No Dietary orders reviewed?: Yes Do you have support at home?: Yes People in Home [RPT]: parent(s)  Medications Reviewed Today: Medications Reviewed Today     Reviewed by Darrall Ellison, LPN (Licensed Practical Nurse) on 03/23/24 at 1408  Med List Status: <None>   Medication Order Taking? Sig Documenting Provider Last Dose Status Informant  aspirin EC 81 MG tablet [id number]  Take 81 mg by mouth daily. Swallow whole.  Patient not taking: Reported on 03/23/2024   [provider]  Active Self  atorvastatin  (LIPITOR) 40 MG tablet [id number] Yes TAKE 1 TABLET BY MOUTH EVERY DAY Adine Hoof, MD  Active Self           Med Note (S[place]TER[place], Ken Patty   Thu Mar 16, 2024  3:27 PM) Patient verified he is still taking this medication             Home Care and Equipment/Supplies: Were Home Health Services Ordered?: NA Any new equipment or medical supplies ordered?: NA  Functional Questionnaire: Do you need assistance with bathing/showering or dressing?: No Do you need assistance with meal preparation?: No Do you need  assistance with eating?: No Do you have difficulty maintaining continence: No Do you need assistance with getting out of bed/getting out of a chair/moving?: No Do you have difficulty managing or taking your medications?: No  Follow up appointments reviewed: PCP Follow-up appointment confirmed?: Yes Date of PCP follow-up appointment?: 03/28/24 Follow-up Provider: [facility] Follow-up appointment confirmed?: NA Do you need transportation to your follow-up appointment?: No Do you understand care options if your condition(s) worsen?: Yes-patient verbalized understanding    SIGNATURE Darrall Ellison, LPN [facility] Nurse Health Advisor Direct Dial [phone]

## 2024-03-28 ENCOUNTER — Inpatient Hospital Stay: Payer: Self-pay | Admitting: Family Medicine

## 2024-03-29 ENCOUNTER — Encounter: Payer: Self-pay | Admitting: Family

## 2024-03-29 NOTE — Progress Notes (Signed)
 Erroneous encounter-disregard

## 2024-04-03 ENCOUNTER — Telehealth: Payer: Self-pay | Admitting: Family Medicine

## 2024-04-03 ENCOUNTER — Encounter: Payer: Self-pay | Admitting: Family

## 2024-04-03 NOTE — Progress Notes (Signed)
 Erroneous encounter-disregard

## 2024-04-03 NOTE — Telephone Encounter (Signed)
Rescheduled missed appointment with patient.

## 2024-04-06 ENCOUNTER — Encounter: Payer: Self-pay | Admitting: Family

## 2024-04-06 ENCOUNTER — Telehealth: Payer: Self-pay | Admitting: Family Medicine

## 2024-04-06 NOTE — Progress Notes (Signed)
 Erroneous encounter-disregard

## 2024-04-06 NOTE — Telephone Encounter (Signed)
 Called pt and left vm to call office back to reschedule missed HFU
